# Patient Record
Sex: Female | Born: 1990
Health system: Southern US, Community
[De-identification: ages and names within clinical notes are randomized; demographics above are authoritative.]

## PROBLEM LIST (undated history)

## (undated) DIAGNOSIS — F32A Depression, unspecified: Secondary | ICD-10-CM

## (undated) DIAGNOSIS — R519 Headache, unspecified: Secondary | ICD-10-CM

## (undated) DIAGNOSIS — T7840XA Allergy, unspecified, initial encounter: Secondary | ICD-10-CM

## (undated) DIAGNOSIS — F329 Major depressive disorder, single episode, unspecified: Secondary | ICD-10-CM

## (undated) HISTORY — DX: Allergy, unspecified, initial encounter: T78.40XA

## (undated) HISTORY — DX: Depression, unspecified: F32.A

## (undated) HISTORY — DX: Major depressive disorder, single episode, unspecified: F32.9

---

## 2011-10-20 ENCOUNTER — Encounter (HOSPITAL_COMMUNITY): Payer: Self-pay | Admitting: Emergency Medicine

## 2011-10-20 ENCOUNTER — Emergency Department (HOSPITAL_COMMUNITY)
Admission: EM | Admit: 2011-10-20 | Discharge: 2011-10-21 | Disposition: A | Discharge status: Home / Self Care | Payer: PRIVATE HEALTH INSURANCE | Attending: Emergency Medicine | Admitting: Emergency Medicine

## 2011-10-20 DIAGNOSIS — L0501 Pilonidal cyst with abscess: Secondary | ICD-10-CM | POA: Insufficient documentation

## 2011-10-20 NOTE — ED Notes (Signed)
PT. REPORTS ABSCESS AT UPPER BUTTOCKS WITH DRAINAGE ONSET YESTERDAY.

## 2011-10-20 NOTE — ED Notes (Signed)
Pt reports having an abscess on the top of her butt crack.  Pt is noted to have a bloody napkin with a draining abscess.  No distress noted.  Resting. Family at bedside.

## 2011-10-21 MED ORDER — OXYCODONE-ACETAMINOPHEN 5-325 MG PO TABS: 1.0000 | ORAL_TABLET | Freq: Four times a day (QID) | ORAL | Status: AC | PRN

## 2011-10-21 MED ORDER — OXYCODONE-ACETAMINOPHEN 5-325 MG PO TABS
1.0000 | ORAL_TABLET | Freq: Once | ORAL | Status: AC
  Administered 2011-10-21: 1 via ORAL
  Filled 2011-10-21: qty 1

## 2011-10-21 MED ORDER — DOXYCYCLINE HYCLATE 100 MG PO TABS
100.0000 mg | ORAL_TABLET | Freq: Once | ORAL | Status: AC
  Administered 2011-10-21: 100 mg via ORAL
  Filled 2011-10-21: qty 1

## 2011-10-21 MED ORDER — DOXYCYCLINE HYCLATE 100 MG PO CAPS: 100.0000 mg | ORAL_CAPSULE | Freq: Two times a day (BID) | ORAL | Status: AC

## 2011-10-21 MED ORDER — CEPHALEXIN 250 MG PO CAPS
500.0000 mg | ORAL_CAPSULE | Freq: Once | ORAL | Status: AC
  Administered 2011-10-21: 500 mg via ORAL
  Filled 2011-10-21: qty 2

## 2011-10-21 NOTE — ED Provider Notes (Addendum)
History     CSN: [id number]  Arrival date & time 10/20/11  2153   First MD Initiated Contact with Patient 10/20/11 2341      Chief Complaint  Patient presents with  . Abscess    (Consider location/radiation/quality/duration/timing/severity/associated sxs/prior treatment) Patient is a 21 y.o. female presenting with abscess. The history is provided by the patient and a friend. No language interpreter was used.  Abscess  This is a new problem. The current episode started yesterday. The onset was gradual. The problem occurs continuously. Affected Location: gluteal cleft. The problem is severe. The abscess is characterized by painfulness and draining. Associated with: nothing. The abscess first occurred at home. Pertinent negatives include not drinking less and no fever. Her past medical history does not include atopy in family. There were no sick contacts. She has received no recent medical care.    History reviewed. No pertinent past medical history.  History reviewed. No pertinent past surgical history.  No family history on file.  History  Substance Use Topics  . Smoking status: Never Smoker   . Smokeless tobacco: Not on file  . Alcohol Use: No    OB History    Grav Para Term Preterm Abortions TAB SAB Ect Mult Living                  Review of Systems  Constitutional: Negative for fever and chills.  HENT: Negative.   Eyes: Negative.   Respiratory: Negative.   Gastrointestinal: Negative.   Genitourinary: Negative.   Musculoskeletal: Negative.   Skin: Positive for wound.  Neurological: Negative.   Hematological: Negative for adenopathy.  Psychiatric/Behavioral: Negative.     Allergies  Review of patient's allergies indicates no known allergies.  Home Medications  No current outpatient prescriptions on file.  BP 125/82  Pulse 112  Temp(Src) 99.3 F (37.4 C) (Oral)  Resp 20  SpO2 99%  LMP 10/05/2011  Physical Exam  Constitutional: She is oriented to  person, place, and time. She appears well-developed and well-nourished. No distress.  HENT:  Head: Normocephalic and atraumatic.  Eyes: Conjunctivae are normal. Pupils are equal, round, and reactive to light.  Neck: Normal range of motion. Neck supple.  Cardiovascular: Normal rate and regular rhythm.   Pulmonary/Chest: Effort normal and breath sounds normal. She has no wheezes.  Abdominal: Soft. Bowel sounds are normal. There is no tenderness. There is no rebound and no guarding.  Genitourinary:       At top of gluteal cleft 2 large openings drainage copious purulent drainage with scant blood, large purulent material on tissue brought by patient  Musculoskeletal: Normal range of motion.  Neurological: She is alert and oriented to person, place, and time.  Skin: Skin is dry. She is not diaphoretic.  Psychiatric: Thought content normal.    ED Course  Procedures (including critical care time)  Labs Reviewed - No data to display No results found.   No diagnosis found.    MDM  Tetanus UTD. No indication to open lesion further has copious amount of purulent drainage present and lesion is completely decompressed, flat and soft.  Follow up with general surgery for marsupialization.  Sitz baths take all medication return immediately for uncontrolled bleeding, fevers, chills, vomiting or diarrhea or swelling or any concerns.  Return for wound check in 2 days.  Patient verbaliZes understanding and agrees to follow up        Makari Portman K Aashika Carta-Rasch, MD 10/21/11 8469  Jasmine Awe, MD 10/21/11 [phone]

## 2012-10-18 ENCOUNTER — Emergency Department (HOSPITAL_COMMUNITY): Payer: PRIVATE HEALTH INSURANCE

## 2012-10-18 ENCOUNTER — Emergency Department (HOSPITAL_COMMUNITY)
Admission: EM | Admit: 2012-10-18 | Discharge: 2012-10-18 | Disposition: A | Discharge status: Home / Self Care | Payer: PRIVATE HEALTH INSURANCE | Attending: Emergency Medicine | Admitting: Emergency Medicine

## 2012-10-18 DIAGNOSIS — R209 Unspecified disturbances of skin sensation: Secondary | ICD-10-CM | POA: Insufficient documentation

## 2012-10-18 DIAGNOSIS — L272 Dermatitis due to ingested food: Secondary | ICD-10-CM | POA: Insufficient documentation

## 2012-10-18 DIAGNOSIS — R070 Pain in throat: Secondary | ICD-10-CM | POA: Insufficient documentation

## 2012-10-18 DIAGNOSIS — T7840XA Allergy, unspecified, initial encounter: Secondary | ICD-10-CM

## 2012-10-18 DIAGNOSIS — R1310 Dysphagia, unspecified: Secondary | ICD-10-CM | POA: Insufficient documentation

## 2012-10-18 DIAGNOSIS — R0602 Shortness of breath: Secondary | ICD-10-CM | POA: Insufficient documentation

## 2012-10-18 DIAGNOSIS — R0789 Other chest pain: Secondary | ICD-10-CM | POA: Insufficient documentation

## 2012-10-18 DIAGNOSIS — L299 Pruritus, unspecified: Secondary | ICD-10-CM | POA: Insufficient documentation

## 2012-10-18 MED ORDER — PREDNISONE 20 MG PO TABS
60.0000 mg | ORAL_TABLET | Freq: Once | ORAL | Status: AC
  Administered 2012-10-18: 60 mg via ORAL
  Filled 2012-10-18: qty 3

## 2012-10-18 MED ORDER — EPINEPHRINE 0.3 MG/0.3ML IJ DEVI: 0.3000 mg | INTRAMUSCULAR | Status: DC | PRN

## 2012-10-18 MED ORDER — DIPHENHYDRAMINE HCL 25 MG PO TABS: 25.0000 mg | ORAL_TABLET | Freq: Four times a day (QID) | ORAL | Status: DC

## 2012-10-18 MED ORDER — PREDNISONE 50 MG PO TABS: ORAL_TABLET | ORAL | Status: DC

## 2012-10-18 MED ORDER — FAMOTIDINE 20 MG PO TABS
20.0000 mg | ORAL_TABLET | Freq: Once | ORAL | Status: AC
  Administered 2012-10-18: 20 mg via ORAL
  Filled 2012-10-18: qty 1

## 2012-10-18 MED ORDER — FAMOTIDINE 20 MG PO TABS: 20.0000 mg | ORAL_TABLET | Freq: Two times a day (BID) | ORAL | Status: DC

## 2012-10-18 NOTE — ED Provider Notes (Signed)
History  This chart was scribed for Glynn Octave, MD by Shari Heritage, ED Scribe. The patient was seen in room A12C/A12C. Patient's care was started at 1457.  CSN: [id number]  Arrival date & time 10/18/12  1434   First MD Initiated Contact with Patient 10/18/12 1457      Chief Complaint  Patient presents with  . Allergic Reaction    The history is provided by the patient. No language interpreter was used.    HPI Comments: Holly Johns is a 22 y.o. female who presents to the Emergency Department complaining of moderate, constant, non-radiating chest tightness and throat discomfort onset 2 hours ago resulting from a possible allergic reaction. Patient reports associated difficulty swallowing, right arm itching, tingling in lips, and mild shortness of breath. Patient denies nausea, vomiting, abdominal pain or rash. Patient has a known allergy to peanuts and think she may have unwittingly eaten a cookie containing peanuts. Patient states that at the time of the incident she could not find her epi pen, so she called EMS. Patient was given Benadryl PO 50 mg by EMS prior to arrival. Patient reports no other significant past medical history. Patient denies possible pregnancy.   No past medical history on file.  No past surgical history on file.  No family history on file.  History  Substance Use Topics  . Smoking status: Never Smoker   . Smokeless tobacco: Not on file  . Alcohol Use: No    OB History    Grav Para Term Preterm Abortions TAB SAB Ect Mult Living                  Review of Systems A complete 10 system review of systems was obtained and all systems are negative except as noted in the HPI and PMH.   Allergies  Peanuts  Home Medications   Current Outpatient Rx  Name  Route  Sig  Dispense  Refill  . DIPHENHYDRAMINE HCL 25 MG PO TABS   Oral   Take 1 tablet (25 mg total) by mouth every 6 (six) hours.   20 tablet   0   . EPINEPHRINE 0.3 MG/0.3ML IJ DEVI  Intramuscular   Inject 0.3 mLs (0.3 mg total) into the muscle as needed.   1 Device   0   . FAMOTIDINE 20 MG PO TABS   Oral   Take 1 tablet (20 mg total) by mouth 2 (two) times daily.   30 tablet   0   . PREDNISONE 50 MG PO TABS      1 tablet PO daily   5 tablet   0     Triage Vitals: BP 129/78  Pulse 78  Temp 98.2 F (36.8 C) (Oral)  Resp 16  Ht 5\' 4"  (1.626 m)  Wt 140 lb (63.504 kg)  BMI 24.03 kg/m2  SpO2 98%  LMP 09/24/2012  Physical Exam  Constitutional: She is oriented to person, place, and time. She appears well-developed and well-nourished. No distress.  HENT:  Head: Normocephalic and atraumatic.  Mouth/Throat: Oropharynx is clear and moist.       Speaking in full sentences. No tongue or lip swelling. No uvular edema.  Eyes: Conjunctivae normal and EOM are normal.  Neck: Neck supple.  Cardiovascular: Normal rate and regular rhythm.   No murmur heard. Pulmonary/Chest: Effort normal and breath sounds normal. No respiratory distress. She has no wheezes. She has no rales. She exhibits tenderness.       L sided chest wall  tenderness  Abdominal: Soft. Bowel sounds are normal.  Musculoskeletal: She exhibits no edema and no tenderness.  Neurological: She is alert and oriented to person, place, and time.  Skin: Skin is warm. No rash noted.  Psychiatric: She has a normal mood and affect. Her behavior is normal.    ED Course  Procedures (including critical care time) DIAGNOSTIC STUDIES: Oxygen Saturation is 98% on room air, normal by my interpretation.    COORDINATION OF CARE: 3:08 PM- Patient informed of current plan for treatment and evaluation and agrees with plan at this time.      Labs Reviewed - No data to display Dg Chest 2 View  10/18/2012  *RADIOLOGY REPORT*  Clinical Data: Allergic reaction.  Shortness of breath.  CHEST - 2 VIEW  Comparison: None  Findings: The cardiac silhouette, mediastinal and hilar contours are normal.  The lungs are clear.  No  pleural effusion.  The bony thorax is intact.  IMPRESSION: Normal chest x-ray.   Original Report Authenticated By: Rudie Meyer, M.D.      1. Allergic reaction       MDM  Chest tightness, shortness of breath, throat irritation after eating a cookie that may contain peanuts. Symptoms have resolved after Benadryl. Speaking in full sentences, no respiratory distress, reproducible chest pain. She could not find her epipen so she called EMS.  Received only benadryl PO.  Vitals stable, no distress.  No tongue, lip, or throat swelling.  Observed in ED for one hour without deterioration. Discharged with steroids, antihistamines, refill EpiPen.   Date: 10/18/2012  Rate: 73  Rhythm: normal sinus rhythm  QRS Axis: normal  Intervals: normal  ST/T Wave abnormalities: normal  Conduction Disutrbances:none  Narrative Interpretation:   Old EKG Reviewed: none available      I personally performed the services described in this documentation, which was scribed in my presence. The recorded information has been reviewed and is accurate.    Glynn Octave, MD 10/18/12 1556

## 2012-10-18 NOTE — ED Notes (Signed)
Patient transported to X-ray 

## 2012-10-18 NOTE — ED Notes (Signed)
Per report from [facility] pt was at her home eating a cookie when she thinks that she may have ingested a peanut or some other type of nut.  She contacted EMS for SOB, R arm itching, and felt like her throat was "closing up".  Pt states that she could not find her epi-pen.  On EMS arrival pt reportedly had no outward signs of a allergic reaction (no hives or swelling).  She was administered 50mg  Benadryl PO by EMS.  Pt is able to talk in complete sentences.  No distress noted.

## 2014-05-01 ENCOUNTER — Emergency Department (HOSPITAL_COMMUNITY)
Admission: EM | Admit: 2014-05-01 | Discharge: 2014-05-01 | Disposition: A | Discharge status: Home / Self Care | Payer: Commercial Indemnity | Attending: Emergency Medicine | Admitting: Emergency Medicine

## 2014-05-01 ENCOUNTER — Encounter (HOSPITAL_COMMUNITY): Payer: Self-pay | Admitting: Emergency Medicine

## 2014-05-01 ENCOUNTER — Emergency Department (HOSPITAL_COMMUNITY): Payer: Commercial Indemnity

## 2014-05-01 DIAGNOSIS — R42 Dizziness and giddiness: Secondary | ICD-10-CM | POA: Insufficient documentation

## 2014-05-01 DIAGNOSIS — R110 Nausea: Secondary | ICD-10-CM | POA: Diagnosis not present

## 2014-05-01 DIAGNOSIS — Z3202 Encounter for pregnancy test, result negative: Secondary | ICD-10-CM | POA: Insufficient documentation

## 2014-05-01 DIAGNOSIS — D649 Anemia, unspecified: Secondary | ICD-10-CM

## 2014-05-01 DIAGNOSIS — R319 Hematuria, unspecified: Secondary | ICD-10-CM | POA: Insufficient documentation

## 2014-05-01 DIAGNOSIS — R1013 Epigastric pain: Secondary | ICD-10-CM | POA: Insufficient documentation

## 2014-05-01 DIAGNOSIS — R109 Unspecified abdominal pain: Secondary | ICD-10-CM | POA: Diagnosis present

## 2014-05-01 DIAGNOSIS — R52 Pain, unspecified: Secondary | ICD-10-CM | POA: Insufficient documentation

## 2014-05-01 LAB — CBC WITH DIFFERENTIAL/PLATELET
Basophils Absolute: 0 10*3/uL (ref 0.0–0.1)
Basophils Relative: 0 % (ref 0–1)
EOS PCT: 0 % (ref 0–5)
Eosinophils Absolute: 0 10*3/uL (ref 0.0–0.7)
HCT: 28.9 % — ABNORMAL LOW (ref 36.0–46.0)
Hemoglobin: 9.1 g/dL — ABNORMAL LOW (ref 12.0–15.0)
LYMPHS ABS: 1.2 10*3/uL (ref 0.7–4.0)
LYMPHS PCT: 17 % (ref 12–46)
MCH: 21.5 pg — ABNORMAL LOW (ref 26.0–34.0)
MCHC: 31.5 g/dL (ref 30.0–36.0)
MCV: 68.3 fL — AB (ref 78.0–100.0)
MONOS PCT: 9 % (ref 3–12)
Monocytes Absolute: 0.6 10*3/uL (ref 0.1–1.0)
Neutro Abs: 5.1 10*3/uL (ref 1.7–7.7)
Neutrophils Relative %: 74 % (ref 43–77)
PLATELETS: 346 10*3/uL (ref 150–400)
RBC: 4.23 MIL/uL (ref 3.87–5.11)
RDW: 16.6 % — ABNORMAL HIGH (ref 11.5–15.5)
WBC: 6.9 10*3/uL (ref 4.0–10.5)

## 2014-05-01 LAB — LIPASE, BLOOD: Lipase: 25 U/L (ref 11–59)

## 2014-05-01 LAB — URINE MICROSCOPIC-ADD ON

## 2014-05-01 LAB — COMPREHENSIVE METABOLIC PANEL
ALBUMIN: 3.4 g/dL — AB (ref 3.5–5.2)
ALK PHOS: 81 U/L (ref 39–117)
ALT: 17 U/L (ref 0–35)
AST: 23 U/L (ref 0–37)
Anion gap: 14 (ref 5–15)
BILIRUBIN TOTAL: 0.4 mg/dL (ref 0.3–1.2)
BUN: 6 mg/dL (ref 6–23)
CHLORIDE: 103 meq/L (ref 96–112)
CO2: 22 meq/L (ref 19–32)
CREATININE: 0.64 mg/dL (ref 0.50–1.10)
Calcium: 9 mg/dL (ref 8.4–10.5)
GFR calc Af Amer: >90 mL/min (ref >90)
Glucose, Bld: 103 mg/dL — ABNORMAL HIGH (ref 70–99)
POTASSIUM: 4.1 meq/L (ref 3.7–5.3)
SODIUM: 139 meq/L (ref 137–147)
Total Protein: 8.6 g/dL — ABNORMAL HIGH (ref 6.0–8.3)

## 2014-05-01 LAB — URINALYSIS, ROUTINE W REFLEX MICROSCOPIC
BILIRUBIN URINE: NEGATIVE
GLUCOSE, UA: NEGATIVE mg/dL
KETONES UR: NEGATIVE mg/dL
Nitrite: NEGATIVE
PH: 6.5 (ref 5.0–8.0)
Protein, ur: NEGATIVE mg/dL
SPECIFIC GRAVITY, URINE: 1.007 (ref 1.005–1.030)
Urobilinogen, UA: 0.2 mg/dL (ref 0.0–1.0)

## 2014-05-01 LAB — POC URINE PREG, ED: Preg Test, Ur: NEGATIVE

## 2014-05-01 MED ORDER — OMEPRAZOLE 20 MG PO CPDR: 20.0000 mg | DELAYED_RELEASE_CAPSULE | Freq: Every day | ORAL | Status: DC

## 2014-05-01 MED ORDER — FENTANYL CITRATE 0.05 MG/ML IJ SOLN
50.0000 ug | INTRAMUSCULAR | Status: DC | PRN
  Administered 2014-05-01: 50 ug via INTRAVENOUS
  Filled 2014-05-01: qty 2

## 2014-05-01 MED ORDER — FAMOTIDINE 20 MG PO TABS
20.0000 mg | ORAL_TABLET | Freq: Once | ORAL | Status: AC
  Administered 2014-05-01: 20 mg via ORAL
  Filled 2014-05-01: qty 1

## 2014-05-01 MED ORDER — GI COCKTAIL ~~LOC~~
30.0000 mL | Freq: Once | ORAL | Status: AC
  Administered 2014-05-01: 30 mL via ORAL
  Filled 2014-05-01: qty 30

## 2014-05-01 MED ORDER — SODIUM CHLORIDE 0.9 % IV BOLUS (SEPSIS)
500.0000 mL | Freq: Once | INTRAVENOUS | Status: AC
  Administered 2014-05-01: 500 mL via INTRAVENOUS

## 2014-05-01 MED ORDER — IOHEXOL 300 MG/ML  SOLN
80.0000 mL | Freq: Once | INTRAMUSCULAR | Status: AC | PRN
  Administered 2014-05-01: 80 mL via INTRAVENOUS

## 2014-05-01 NOTE — ED Notes (Signed)
Pt presents to department for evaluation of abdominal pain. Ongoing x1 week. Denies urinary symptoms. Denies vaginal bleeding/discharge. 6/10 pain upon arrival, becomes worse with movement. Pt is alert and oriented x4.

## 2014-05-01 NOTE — Discharge Instructions (Signed)
If you were given medicines take as directed.  If you are on coumadin or contraceptives realize their levels and effectiveness is altered by many different medicines.  If you have any reaction (rash, tongues swelling, other) to the medicines stop taking and see a physician.   Please follow up as directed and return to the ER or see a physician for new or worsening symptoms.  Thank you. Filed Vitals:   05/01/14 1930 05/01/14 2018 05/01/14 2020 05/01/14 2022  BP: 110/77 105/65  105/65  Pulse: 88  90 80  Temp:      TempSrc:      Resp: 24   21  SpO2: 100%  100% 100%    Abdominal Pain, Women Abdominal (stomach, pelvic, or belly) pain can be caused by many things. It is important to tell your doctor:  The location of the pain.  Does it come and go or is it present all the time?  Are there things that start the pain (eating certain foods, exercise)?  Are there other symptoms associated with the pain (fever, nausea, vomiting, diarrhea)? All of this is helpful to know when trying to find the cause of the pain. CAUSES   Stomach: virus or bacteria infection, or ulcer.  Intestine: appendicitis (inflamed appendix), regional ileitis (Crohn's disease), ulcerative colitis (inflamed colon), irritable bowel syndrome, diverticulitis (inflamed diverticulum of the colon), or cancer of the stomach or intestine.  Gallbladder disease or stones in the gallbladder.  Kidney disease, kidney stones, or infection.  Pancreas infection or cancer.  Fibromyalgia (pain disorder).  Diseases of the female organs:  Uterus: fibroid (non-cancerous) tumors or infection.  Fallopian tubes: infection or tubal pregnancy.  Ovary: cysts or tumors.  Pelvic adhesions (scar tissue).  Endometriosis (uterus lining tissue growing in the pelvis and on the pelvic organs).  Pelvic congestion syndrome (female organs filling up with blood just before the menstrual period).  Pain with the menstrual period.  Pain with  ovulation (producing an egg).  Pain with an IUD (intrauterine device, birth control) in the uterus.  Cancer of the female organs.  Functional pain (pain not caused by a disease, may improve without treatment).  Psychological pain.  Depression. DIAGNOSIS  Your doctor will decide the seriousness of your pain by doing an examination.  Blood tests.  X-rays.  Ultrasound.  CT scan (computed tomography, special type of X-ray).  MRI (magnetic resonance imaging).  Cultures, for infection.  Barium enema (dye inserted in the large intestine, to better view it with X-rays).  Colonoscopy (looking in intestine with a lighted tube).  Laparoscopy (minor surgery, looking in abdomen with a lighted tube).  Major abdominal exploratory surgery (looking in abdomen with a large incision). TREATMENT  The treatment will depend on the cause of the pain.   Many cases can be observed and treated at home.  Over-the-counter medicines recommended by your caregiver.  Prescription medicine.  Antibiotics, for infection.  Birth control pills, for painful periods or for ovulation pain.  Hormone treatment, for endometriosis.  Nerve blocking injections.  Physical therapy.  Antidepressants.  Counseling with a psychologist or psychiatrist.  Minor or major surgery. HOME CARE INSTRUCTIONS   Do not take laxatives, unless directed by your caregiver.  Take over-the-counter pain medicine only if ordered by your caregiver. Do not take aspirin because it can cause an upset stomach or bleeding.  Try a clear liquid diet (broth or water) as ordered by your caregiver. Slowly move to a bland diet, as tolerated, if the pain is  related to the stomach or intestine.  Have a thermometer and take your temperature several times a day, and record it.  Bed rest and sleep, if it helps the pain.  Avoid sexual intercourse, if it causes pain.  Avoid stressful situations.  Keep your follow-up appointments and  tests, as your caregiver orders.  If the pain does not go away with medicine or surgery, you may try:  Acupuncture.  Relaxation exercises (yoga, meditation).  Group therapy.  Counseling. SEEK MEDICAL CARE IF:   You notice certain foods cause stomach pain.  Your home care treatment is not helping your pain.  You need stronger pain medicine.  You want your IUD removed.  You feel faint or lightheaded.  You develop nausea and vomiting.  You develop a rash.  You are having side effects or an allergy to your medicine. SEEK IMMEDIATE MEDICAL CARE IF:   Your pain does not go away or gets worse.  You have a fever.  Your pain is felt only in portions of the abdomen. The right side could possibly be appendicitis. The left lower portion of the abdomen could be colitis or diverticulitis.  You are passing blood in your stools (bright red or black tarry stools, with or without vomiting).  You have blood in your urine.  You develop chills, with or without a fever.  You pass out. MAKE SURE YOU:   Understand these instructions.  Will watch your condition.  Will get help right away if you are not doing well or get worse. Document Released: 07/04/2007 Document Revised: 01/21/2014 Document Reviewed: 07/24/2009 [facility] Patient Information 2015 [place], [place]. This information is not intended to replace advice given to you by your health care provider. Make sure you discuss any questions you have with your health care provider.

## 2014-05-01 NOTE — ED Provider Notes (Signed)
CSN: [id number]     Arrival date & time 05/01/14  1338 History   First MD Initiated Contact with Patient 05/01/14 1617     Chief Complaint  Patient presents with  . Abdominal Pain     (Consider location/radiation/quality/duration/timing/severity/associated sxs/prior Treatment) HPI Comments: 23 year old female with anemia history presents with worsening upper abdominal pain for the past week. No history of similar. No vaginal symptoms her current vaginal bleeding. Patient denies pelvic infectious symptoms. Patient denies reflux oral surgery history, no alcohol or smoking history. Patient held the otherwise. Severe ache central and epigastric region. No known gallbladder problems, no worsening with eating.  Patient is a 23 y.o. female presenting with abdominal pain. The history is provided by the patient.  Abdominal Pain Associated symptoms: nausea   Associated symptoms: no chest pain, no chills, no dysuria, no fever, no shortness of breath and no vomiting     History reviewed. No pertinent past medical history. History reviewed. No pertinent past surgical history. No family history on file. History  Substance Use Topics  . Smoking status: Never Smoker   . Smokeless tobacco: Not on file  . Alcohol Use: No   OB History   Grav Para Term Preterm Abortions TAB SAB Ect Mult Living                 Review of Systems  Constitutional: Negative for fever and chills.  HENT: Negative for congestion.   Eyes: Negative for visual disturbance.  Respiratory: Negative for shortness of breath.   Cardiovascular: Negative for chest pain.  Gastrointestinal: Positive for nausea and abdominal pain. Negative for vomiting.  Genitourinary: Negative for dysuria and flank pain.  Musculoskeletal: Negative for back pain, neck pain and neck stiffness.  Skin: Negative for rash.  Neurological: Positive for light-headedness. Negative for headaches.      Allergies  Peanuts  Home Medications   Prior to  Admission medications   Medication Sig Start Date End Date Taking? Authorizing Provider  EPINEPHrine 0.3 mg/0.3 mL IJ SOAJ injection Inject 0.3 mg into the muscle daily as needed (allergic reaction).   Yes Historical Provider, MD  medroxyPROGESTERone (DEPO-PROVERA) 150 MG/ML injection Inject 150 mg into the muscle every 3 (three) months.   Yes Historical Provider, MD  naproxen sodium (ANAPROX) 220 MG tablet Take 660 mg by mouth daily as needed (pain).   Yes Historical Provider, MD   BP 123/76  Pulse 116  Temp(Src) 98.5 F (36.9 C) (Oral)  Resp 16  SpO2 99% Physical Exam  Nursing note and vitals reviewed. Constitutional: She is oriented to person, place, and time. She appears well-developed and well-nourished.  HENT:  Head: Normocephalic and atraumatic.  Mild dry mucous membranes  Eyes: Conjunctivae are normal. Right eye exhibits no discharge. Left eye exhibits no discharge.  Neck: Normal range of motion. Neck supple. No tracheal deviation present.  Cardiovascular: Normal rate and regular rhythm.   Pulmonary/Chest: Effort normal and breath sounds normal.  Abdominal: Soft. She exhibits no distension. There is tenderness (epigastric, central). There is no guarding.  Musculoskeletal: She exhibits no edema.  Neurological: She is alert and oriented to person, place, and time.  Skin: Skin is warm. No rash noted.  Psychiatric: She has a normal mood and affect.    ED Course  Procedures (including critical care time) Labs Review Labs Reviewed  CBC WITH DIFFERENTIAL - Abnormal; Notable for the following:    Hemoglobin 9.1 (*)    HCT 28.9 (*)    MCV 68.3 (*)  MCH 21.5 (*)    RDW 16.6 (*)    All other components within normal limits  COMPREHENSIVE METABOLIC PANEL - Abnormal; Notable for the following:    Glucose, Bld 103 (*)    Total Protein 8.6 (*)    Albumin 3.4 (*)    All other components within normal limits  URINALYSIS, ROUTINE W REFLEX MICROSCOPIC - Abnormal; Notable for the  following:    Hgb urine dipstick LARGE (*)    Leukocytes, UA TRACE (*)    All other components within normal limits  LIPASE, BLOOD  URINE MICROSCOPIC-ADD ON  POC URINE PREG, ED    Imaging Review Ct Abdomen Pelvis W Contrast  05/01/2014   CLINICAL DATA:  Abdominal pain for 1 week.  EXAM: CT ABDOMEN AND PELVIS WITH CONTRAST  TECHNIQUE: Multidetector CT imaging of the abdomen and pelvis was performed using the standard protocol following bolus administration of intravenous contrast.  CONTRAST:  80mL OMNIPAQUE IOHEXOL 300 MG/ML  SOLN  COMPARISON:  None.  FINDINGS: Clear lung bases.  Heart is normal in size.  Liver, spleen, gallbladder and pancreas are unremarkable. There is a trace amount of fluid attenuation between the gallbladder and liver which is nonspecific. No bile duct dilation.  No adrenal masses. Normal kidneys, ureters and bladder. Uterus and adnexa are unremarkable.  No adenopathy  Retrocecal appendix measures mildly dilated at 7.2 mm, but the wall is thin and the appendix contains significant air. This is likely a normal variant in this patient. There is some subtle haziness adjacent to the ascending colon and hepatic flexure, but no bowel wall thickening is seen. Mesentery otherwise unremarkable. Colon small bowel are unremarkable.  No bony abnormality.  IMPRESSION: 1. There is subtle haziness in the fat along the inferior margin of the liver adjacent to the hepatic flexure of the colon and upper ascending colon. There is also a small amount of fluid attenuation material between the gallbladder and liver. Consider liver inflammation in the proper clinical setting. No convincing colitis. No gallbladder wall thickening to suggest acute cholecystitis. Appendix appears mildly distended, but has a thin wall and is mostly filled with air. This is felt to be a normal variant for this patient. 2. No other evidence of an acute abnormality. Exam otherwise unremarkable.   Electronically Signed   By: Amie Portland M.D.   On: 05/01/2014 20:17     EKG Interpretation None      MDM   Final diagnoses:  Abdominal pain, acute  Hematuria  Anemia, unspecified   Healthy patient with worsening central and epigastric nominal pain. Discussed differential diagnosis including reflux, ulcer, atypical appendectomy. Patient has possible history of clotting/bleeding disorder patient unsure. Plan for CT abdomen pelvis for further details of pain and discussed outpatient followup if unremarkable. Patient has anemia history, he will and 9 without active bleeding in the ER, discussed outpatient followup for this as well.  Pt improved on recheck, CT results unremarkable.  Results and differential diagnosis were discussed with the patient/parent/guardian. Close follow up outpatient was discussed, comfortable with the plan.   Medications  gi cocktail (Maalox,Lidocaine,Donnatal) (not administered)  famotidine (PEPCID) tablet 20 mg (not administered)  fentaNYL (SUBLIMAZE) injection 50 mcg (not administered)  sodium chloride 0.9 % bolus 500 mL (not administered)    Filed Vitals:   05/01/14 1645 05/01/14 1700 05/01/14 1715 05/01/14 1730  BP: 105/63 102/64 104/64 103/80  Pulse: 99 101 98 101  Temp:      TempSrc:  Resp:      SpO2: 98% 100% 96% 100%        Enid SkeensJoshua M Ruthy Forry, MD 05/02/14 0120

## 2014-07-25 ENCOUNTER — Encounter (HOSPITAL_COMMUNITY): Payer: Self-pay

## 2014-07-25 ENCOUNTER — Emergency Department (HOSPITAL_COMMUNITY): Payer: Commercial Indemnity

## 2014-07-25 ENCOUNTER — Emergency Department (HOSPITAL_COMMUNITY)
Admission: EM | Admit: 2014-07-25 | Discharge: 2014-07-25 | Disposition: A | Discharge status: Home / Self Care | Payer: Commercial Indemnity | Attending: Emergency Medicine | Admitting: Emergency Medicine

## 2014-07-25 DIAGNOSIS — S79912A Unspecified injury of left hip, initial encounter: Secondary | ICD-10-CM | POA: Insufficient documentation

## 2014-07-25 DIAGNOSIS — S299XXA Unspecified injury of thorax, initial encounter: Secondary | ICD-10-CM | POA: Insufficient documentation

## 2014-07-25 DIAGNOSIS — Y9389 Activity, other specified: Secondary | ICD-10-CM | POA: Insufficient documentation

## 2014-07-25 DIAGNOSIS — Z79899 Other long term (current) drug therapy: Secondary | ICD-10-CM | POA: Insufficient documentation

## 2014-07-25 DIAGNOSIS — Y92410 Unspecified street and highway as the place of occurrence of the external cause: Secondary | ICD-10-CM | POA: Diagnosis not present

## 2014-07-25 DIAGNOSIS — S62502A Fracture of unspecified phalanx of left thumb, initial encounter for closed fracture: Secondary | ICD-10-CM

## 2014-07-25 DIAGNOSIS — S6992XA Unspecified injury of left wrist, hand and finger(s), initial encounter: Secondary | ICD-10-CM | POA: Diagnosis present

## 2014-07-25 DIAGNOSIS — S62522A Displaced fracture of distal phalanx of left thumb, initial encounter for closed fracture: Secondary | ICD-10-CM | POA: Insufficient documentation

## 2014-07-25 MED ORDER — IBUPROFEN 800 MG PO TABS
800.0000 mg | ORAL_TABLET | Freq: Once | ORAL | Status: AC
  Administered 2014-07-25: 800 mg via ORAL
  Filled 2014-07-25: qty 1

## 2014-07-25 MED ORDER — TRAMADOL HCL 50 MG PO TABS: 50.0000 mg | ORAL_TABLET | Freq: Four times a day (QID) | ORAL | Status: DC | PRN

## 2014-07-25 NOTE — Discharge Instructions (Signed)
Finger Fracture °A finger fracture is when one or more bones in the finger break.  °HOME CARE  °· Wear the splint, tape, or cast as long as told by your doctor. °· Keep your fingers in the position your doctor tell you to. °· Raise (elevate) the injured area above the level of the heart. °· Only take medicine as told by your doctor. °· Put ice on the injured area. °¨ Put ice in a plastic bag. °¨ Place a towel between the skin and the bag. °¨ Leave the ice on for 15-20 minutes, 03-04 times a day. °· Follow up with your doctor. °· Ask what exercises you can do when the splint comes off. °GET HELP RIGHT AWAY IF:  °· The fingernails are white or bluish. °· You have pain not helped by medicine. °· You cannot move your fingertips. °· You lose feeling (numbness) in the injured finger(s). °MAKE SURE YOU:  °· Understand these instructions. °· Will watch this condition. °· Will get help right away if you are not doing well or get worse. °Document Released: 02/23/2008 Document Revised: 11/29/2011 Document Reviewed: 02/23/2008 °[employer]® Patient Information ©2015 [employer]. This information is not intended to replace advice given to you by your health care provider. Make sure you discuss any questions you have with your health care provider. ° °
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
## 2017-03-07 ENCOUNTER — Ambulatory Visit (INDEPENDENT_AMBULATORY_CARE_PROVIDER_SITE_OTHER): Payer: Managed Care, Other (non HMO) | Admitting: Family Medicine

## 2017-03-07 VITALS — BP 108/74 | HR 72 | Temp 98.1°F | Ht 64.5 in | Wt 135.4 lb

## 2017-03-07 DIAGNOSIS — F339 Major depressive disorder, recurrent, unspecified: Secondary | ICD-10-CM

## 2017-03-07 DIAGNOSIS — Z111 Encounter for screening for respiratory tuberculosis: Secondary | ICD-10-CM | POA: Diagnosis not present

## 2017-03-07 LAB — CBC
HCT: 34.6 % — ABNORMAL LOW (ref 36.0–46.0)
Hemoglobin: 11.1 g/dL — ABNORMAL LOW (ref 12.0–15.0)
MCHC: 32 g/dL (ref 30.0–36.0)
MCV: 72.6 fl — ABNORMAL LOW (ref 78.0–100.0)
Platelets: 408 10*3/uL — ABNORMAL HIGH (ref 150.0–400.0)
RBC: 4.76 Mil/uL (ref 3.87–5.11)
RDW: 16.7 % — ABNORMAL HIGH (ref 11.5–15.5)
WBC: 8.1 10*3/uL (ref 4.0–10.5)

## 2017-03-07 LAB — COMPREHENSIVE METABOLIC PANEL
ALT: 10 U/L (ref 0–35)
AST: 19 U/L (ref 0–37)
Albumin: 4.4 g/dL (ref 3.5–5.2)
Alkaline Phosphatase: 78 U/L (ref 39–117)
BUN: 14 mg/dL (ref 6–23)
CO2: 28 mEq/L (ref 19–32)
Calcium: 9.9 mg/dL (ref 8.4–10.5)
Chloride: 101 mEq/L (ref 96–112)
Creatinine, Ser: 0.69 mg/dL (ref 0.40–1.20)
GFR: 133.71 mL/min (ref >60.00)
Glucose, Bld: 85 mg/dL (ref 70–99)
Potassium: 4 mEq/L (ref 3.5–5.1)
Sodium: 135 mEq/L (ref 135–145)
Total Bilirubin: 0.2 mg/dL (ref 0.2–1.2)
Total Protein: 8.5 g/dL — ABNORMAL HIGH (ref 6.0–8.3)

## 2017-03-07 LAB — TSH: TSH: 2.71 u[IU]/mL (ref 0.35–4.50)

## 2017-03-07 NOTE — Progress Notes (Signed)
Holly Johns is a 26 y.o. female [facility]   Patient Care Team: Helane RimaWallace, Steed Kanaan, DO as PCP - General (Family Medicine)   History of Present Illness:   Britt BottomJamie Wheeley CMA acting as scribe for Dr. Earlene PlaterWallace.  Depression         This is a new problem.  The current episode started more than 1 month ago.   The onset quality is gradual.   The problem occurs daily.  The problem has been waxing and waning since onset.  Associated symptoms include no headaches.     The symptoms are aggravated by social issues.  Past treatments include nothing.  Compliance with prior treatments: [facility] and is working on the wellness treatment instead of medication.   Improvement on treatment: She has only seen counsler once.   Risk factors include stress.   Health Maintenance Due  Topic Date Due  . HIV Screening  07/16/2007  . TETANUS/TDAP  07/16/2011  . PAP SMEAR  07/15/2013   PMHx, SurgHx, SocialHx, Medications, and Allergies were reviewed in the Visit Navigator and updated as appropriate.   Past Medical History:  Diagnosis Date  . Allergy   . Depression    History reviewed. No pertinent surgical history.  Family History  Problem Relation Age of Onset  . Diabetes Mother   . Stroke Mother   . Diabetes Father    Social History  Substance Use Topics  . Smoking status: Never Smoker  . Smokeless tobacco: Never Used  . Alcohol use No   Current Medications and Allergies:   Current Outpatient Prescriptions:  .  EPINEPHrine 0.3 mg/0.3 mL IJ SOAJ injection, Inject 0.3 mg into the muscle daily as needed (allergic reaction)., Disp: , Rfl:   Allergies  Allergen Reactions  . Peanuts [Peanut Oil] Hives and Itching   Review of Systems:   Review of Systems  Constitutional: Negative for chills, fever and malaise/fatigue.  HENT: Negative for ear pain, sinus pain and sore throat.   Eyes: Negative for blurred vision and double vision.  Respiratory: Negative for  cough, shortness of breath and wheezing.   Cardiovascular: Negative for chest pain, palpitations and leg swelling.  Gastrointestinal: Negative for abdominal pain, nausea and vomiting.  Musculoskeletal: Positive for back pain. Negative for joint pain and neck pain.       Chronic.   Neurological: Negative for dizziness and headaches.  Psychiatric/Behavioral: Negative for depression, hallucinations and memory loss.   Vitals:   Vitals:   03/07/17 1007  BP: 108/74  Pulse: 72  Temp: 98.1 F (36.7 C)  TempSrc: Oral  SpO2: 100%  Weight: 135 lb 6.4 oz (61.4 kg)  Height: 5' 4.5" (1.638 m)     Body mass index is 22.88 kg/m.  Physical Exam:   Physical Exam  Constitutional: She appears well-developed and well-nourished. No distress.  HENT:  Head: Normocephalic and atraumatic.  Eyes: EOM are normal. Pupils are equal, round, and reactive to light.  Neck: Normal range of motion. Neck supple. No thyromegaly present.  Cardiovascular: Normal rate, regular rhythm, normal heart sounds and intact distal pulses.   Pulmonary/Chest: Effort normal.  Abdominal: Soft.  Skin: Skin is warm.  Psychiatric: She has a normal mood and affect. Her behavior is normal.  Nursing note and vitals reviewed.  Results for orders placed or performed in visit on 03/07/17  CBC  Result Value Ref Range   WBC 8.1 4.0 - 10.5 K/uL   RBC 4.76 3.87 -  5.11 Mil/uL   Platelets 408.0 (H) 150.0 - 400.0 K/uL   Hemoglobin 11.1 (L) 12.0 - 15.0 g/dL   HCT 16.1 (L) 09.6 - 04.5 %   MCV 72.6 (L) 78.0 - 100.0 fl   MCHC 32.0 30.0 - 36.0 g/dL   RDW 40.9 (H) 81.1 - 91.4 %  Comprehensive metabolic panel  Result Value Ref Range   Sodium 135 135 - 145 mEq/L   Potassium 4.0 3.5 - 5.1 mEq/L   Chloride 101 96 - 112 mEq/L   CO2 28 19 - 32 mEq/L   Glucose, Bld 85 70 - 99 mg/dL   BUN 14 6 - 23 mg/dL   Creatinine, Ser 7.82 0.40 - 1.20 mg/dL   Total Bilirubin 0.2 0.2 - 1.2 mg/dL   Alkaline Phosphatase 78 39 - 117 U/L   AST 19 0 - 37  U/L   ALT 10 0 - 35 U/L   Total Protein 8.5 (H) 6.0 - 8.3 g/dL   Albumin 4.4 3.5 - 5.2 g/dL   Calcium 9.9 8.4 - 95.6 mg/dL   GFR 213.08 >65.78 mL/min  TSH  Result Value Ref Range   TSH 2.71 0.35 - 4.50 uIU/mL   Assessment and Plan:   Marshayla was seen today for establish care.  Diagnoses and all orders for this visit:  Depression, recurrent (HCC) Comments: With atypical features. No organic causes found on labwork. We discussed medication options. She is already in therapy. No red flags. Will offer support.  Orders: -     CBC -     Comprehensive metabolic panel -     TSH  Visit for TB skin test -     PPD   . Reviewed expectations re: course of current medical issues. . Discussed self-management of symptoms. . Outlined signs and symptoms indicating need for more acute intervention. . Patient verbalized understanding and all questions were answered. Marland Kitchen Health Maintenance issues including appropriate healthy diet, exercise, and smoking avoidance were discussed with patient. . See orders for this visit as documented in the electronic medical record. . Patient received an After Visit Summary.  CMA served as Neurosurgeon during this visit. History, Physical, and Plan performed by medical provider. The above documentation has been reviewed and is accurate and complete. Helane Rima, D.O.  Helane Rima, DO [place] 03/10/2017  Future Appointments Date Time Provider Department Center  06/07/2017 10:45 AM Helane Rima, DO [facility] None

## 2017-03-08 ENCOUNTER — Encounter: Payer: Self-pay | Admitting: Family Medicine

## 2017-03-09 ENCOUNTER — Ambulatory Visit: Payer: Managed Care, Other (non HMO) | Admitting: *Deleted

## 2017-03-09 LAB — TB SKIN TEST
Induration: 0 mm
TB Skin Test: NEGATIVE

## 2017-03-14 DIAGNOSIS — Z111 Encounter for screening for respiratory tuberculosis: Secondary | ICD-10-CM | POA: Diagnosis not present

## 2017-03-18 ENCOUNTER — Telehealth: Payer: Self-pay | Admitting: Family Medicine

## 2017-03-18 NOTE — Telephone Encounter (Signed)
Patient returning phone call to Autumn. Please call patient to advise about lab results.

## 2017-03-21 MED ORDER — SERTRALINE HCL 50 MG PO TABS: 50.0000 mg | ORAL_TABLET | Freq: Every day | ORAL | 5 refills | Status: DC

## 2017-03-21 NOTE — Telephone Encounter (Signed)
Spoke with patient. She is aware of her lab results, medication has been sent into the pharmacy. She was made aware to call if she has any questions.

## 2017-06-07 ENCOUNTER — Ambulatory Visit (INDEPENDENT_AMBULATORY_CARE_PROVIDER_SITE_OTHER): Payer: Managed Care, Other (non HMO) | Admitting: Family Medicine

## 2017-06-07 ENCOUNTER — Encounter: Payer: Self-pay | Admitting: Family Medicine

## 2017-06-07 ENCOUNTER — Telehealth: Payer: Self-pay | Admitting: Family Medicine

## 2017-06-07 VITALS — BP 108/64 | HR 88 | Temp 98.3°F | Ht 65.0 in | Wt 129.8 lb

## 2017-06-07 DIAGNOSIS — F341 Dysthymic disorder: Secondary | ICD-10-CM

## 2017-06-07 DIAGNOSIS — G4726 Circadian rhythm sleep disorder, shift work type: Secondary | ICD-10-CM

## 2017-06-07 NOTE — Telephone Encounter (Signed)
New Script for Provigil  [facility].  Ty,  -LL

## 2017-06-07 NOTE — Patient Instructions (Signed)
Check out PROVIGIL and MELATONIN (2-3 mg po q hs).

## 2017-06-07 NOTE — Progress Notes (Signed)
Holly Johns is a 26 y.o. female is here for follow up.  History of Present Illness:   Britt Bottom CMA acting as scribe for Dr. Earlene Plater.  HPI: Patient comes in today for follow up on her depression. She has been taking medication as prescribed. She could tell a difference at the beginning of taking medication, but now can't tell a difference.   Fatigue is her most concerning symptoms.  She is going to school, working. TH, F, Sat - night shift. Sleeping excessively the rest of the week. Considers herself a "night owl" now.   Health Maintenance Due  Topic Date Due  . HIV Screening  07/16/2007  . TETANUS/TDAP  07/16/2011  . PAP SMEAR  07/15/2013   Depression screen [facility] 2/9 06/07/2017 03/07/2017  Decreased Interest 2 2  Down, Depressed, Hopeless 3 2  PHQ - 2 Score 5 4  Altered sleeping 3 3  Tired, decreased energy 3 2  Change in appetite 3 3  Feeling bad or failure about yourself  1 0  Trouble concentrating 2 3  Moving slowly or fidgety/restless 2 2  Suicidal thoughts 0 0  PHQ-9 Score 19 17  Difficult doing work/chores Very difficult -   PMHx, SurgHx, SocialHx, FamHx, Medications, and Allergies were reviewed in the Visit Navigator and updated as appropriate.   Patient Active Problem List   Diagnosis Date Noted  . Dysthymia 06/12/2017  . Shift work sleep disorder 06/12/2017   Social History  Substance Use Topics  . Smoking status: Never Smoker  . Smokeless tobacco: Never Used  . Alcohol use No   Current Medications and Allergies:   Current Outpatient Prescriptions:  .  EPINEPHrine 0.3 mg/0.3 mL IJ SOAJ injection, Inject 0.3 mg into the muscle daily as needed (allergic reaction)., Disp: , Rfl:  .  sertraline (ZOLOFT) 50 MG tablet, Take 1 tablet (50 mg total) by mouth daily., Disp: 30 tablet, Rfl: 5   Allergies  Allergen Reactions  . Peanuts [Peanut Oil] Hives and Itching   Review of Systems   Pertinent items are noted in the HPI. Otherwise, ROS is  negative.  Vitals:   Vitals:   06/07/17 1100  BP: 108/64  Pulse: 88  Temp: 98.3 F (36.8 C)  TempSrc: Oral  SpO2: 98%  Weight: 129 lb 12.8 oz (58.9 kg)  Height:  (1.651 m)     Body mass index is 21.6 kg/m.   Physical Exam:   Physical Exam  Constitutional: She appears well-nourished.  HENT:  Head: Normocephalic and atraumatic.  Eyes: Pupils are equal, round, and reactive to light. EOM are normal.  Neck: Normal range of motion. Neck supple.  Cardiovascular: Normal rate, regular rhythm, normal heart sounds and intact distal pulses.   Pulmonary/Chest: Effort normal.  Abdominal: Soft.  Skin: Skin is warm.  Psychiatric: She has a normal mood and affect. Her behavior is normal.  Nursing note and vitals reviewed.   Results for orders placed or performed in visit on 03/07/17  CBC  Result Value Ref Range   WBC 8.1 4.0 - 10.5 K/uL   RBC 4.76 3.87 - 5.11 Mil/uL   Platelets 408.0 (H) 150.0 - 400.0 K/uL   Hemoglobin 11.1 (L) 12.0 - 15.0 g/dL   HCT 16.1 (L) 09.6 - 04.5 %   MCV 72.6 (L) 78.0 - 100.0 fl   MCHC 32.0 30.0 - 36.0 g/dL   RDW 40.9 (H) 81.1 - 91.4 %  Comprehensive metabolic panel  Result Value Ref Range   Sodium  135 135 - 145 mEq/L   Potassium 4.0 3.5 - 5.1 mEq/L   Chloride 101 96 - 112 mEq/L   CO2 28 19 - 32 mEq/L   Glucose, Bld 85 70 - 99 mg/dL   BUN 14 6 - 23 mg/dL   Creatinine, Ser 8.11 0.40 - 1.20 mg/dL   Total Bilirubin 0.2 0.2 - 1.2 mg/dL   Alkaline Phosphatase 78 39 - 117 U/L   AST 19 0 - 37 U/L   ALT 10 0 - 35 U/L   Total Protein 8.5 (H) 6.0 - 8.3 g/dL   Albumin 4.4 3.5 - 5.2 g/dL   Calcium 9.9 8.4 - 91.4 mg/dL   GFR 782.95 >62.13 mL/min  TSH  Result Value Ref Range   TSH 2.71 0.35 - 4.50 uIU/mL  PPD  Result Value Ref Range   TB Skin Test Negative    Induration 0.00 mm   Assessment and Plan:   [name] was seen today for depression.  Diagnoses and all orders for this visit:  Dysthymia Comments: Stable. Will continue current regimen  at this time - SSRI plus therapy. Discussed possiblity of Wellbutrin in the future.   Shift work sleep disorder Comments: Reviewed Provigil as an option. Excess fatigue seems to be one of her major "depression" symptoms, but her schedule is off. Will trial to see how much it helps.   . Reviewed expectations re: course of current medical issues. . Discussed self-management of symptoms. . Outlined signs and symptoms indicating need for more acute intervention. . Patient verbalized understanding and all questions were answered. Marland Kitchen Health Maintenance issues including appropriate healthy diet, exercise, and smoking avoidance were discussed with patient. . See orders for this visit as documented in the electronic medical record. . Patient received an After Visit Summary.  CMA served as Neurosurgeon during this visit. History, Physical, and Plan performed by medical provider. The above documentation has been reviewed and is accurate and complete. Helane Rima, D.O.  Helane Rima, DO [place] 06/12/2017  No future appointments.

## 2017-06-08 MED ORDER — MODAFINIL 200 MG PO TABS: ORAL_TABLET | ORAL | 0 refills | Status: DC

## 2017-06-08 NOTE — Telephone Encounter (Signed)
Completed.

## 2017-06-12 DIAGNOSIS — F341 Dysthymic disorder: Secondary | ICD-10-CM | POA: Insufficient documentation

## 2017-06-12 DIAGNOSIS — G4726 Circadian rhythm sleep disorder, shift work type: Secondary | ICD-10-CM | POA: Insufficient documentation

## 2017-06-17 ENCOUNTER — Telehealth: Payer: Self-pay

## 2017-06-17 NOTE — Telephone Encounter (Signed)
Modafinil PA initiated through Cover My Meds.  Waiting for insurance decision.

## 2017-06-21 NOTE — Telephone Encounter (Signed)
Modafinil approved through 06/20/2018.  Patient's pharmacy notified.

## 2017-06-27 ENCOUNTER — Telehealth: Payer: Self-pay | Admitting: Family Medicine

## 2017-06-27 ENCOUNTER — Other Ambulatory Visit: Payer: Self-pay

## 2017-06-27 NOTE — Telephone Encounter (Signed)
PA was approved through 06/20/2018.  Approval faxed to pharmacy again.

## 2017-06-27 NOTE — Telephone Encounter (Signed)
Patient called and stated that she was confused due to the pharmacy stating that her medication modafinil (PROVIGIL) 200 MG tablet was filled/ delivered on 10/2. I advised the patient that from our records I see that the medication was sent in 9/19 as a fax. Patient is confused and needs to speak to a nurse. Call patient to advise.

## 2018-03-09 ENCOUNTER — Encounter: Payer: Self-pay | Admitting: Family Medicine

## 2018-03-10 ENCOUNTER — Encounter: Payer: Self-pay | Admitting: Family Medicine

## 2018-03-12 ENCOUNTER — Other Ambulatory Visit: Payer: Self-pay

## 2018-03-12 ENCOUNTER — Encounter (HOSPITAL_COMMUNITY): Payer: Self-pay | Admitting: Emergency Medicine

## 2018-03-12 ENCOUNTER — Ambulatory Visit (HOSPITAL_COMMUNITY)
Admission: EM | Admit: 2018-03-12 | Discharge: 2018-03-12 | Disposition: A | Discharge status: Home / Self Care | Payer: Managed Care, Other (non HMO) | Attending: Family Medicine | Admitting: Family Medicine

## 2018-03-12 DIAGNOSIS — L0231 Cutaneous abscess of buttock: Secondary | ICD-10-CM

## 2018-03-12 DIAGNOSIS — L0291 Cutaneous abscess, unspecified: Secondary | ICD-10-CM

## 2018-03-12 MED ORDER — SULFAMETHOXAZOLE-TRIMETHOPRIM 800-160 MG PO TABS: 1.0000 | ORAL_TABLET | Freq: Two times a day (BID) | ORAL | 0 refills | Status: DC

## 2018-03-12 MED ORDER — HYDROCODONE-ACETAMINOPHEN 5-325 MG PO TABS: 1.0000 | ORAL_TABLET | Freq: Four times a day (QID) | ORAL | 0 refills | Status: AC | PRN

## 2018-03-12 NOTE — ED Provider Notes (Signed)
[facility]    CSN: [id number] Arrival date & time: 03/12/18  1954     History   Chief Complaint Chief Complaint  Patient presents with  . Abscess    HPI Holly Johns is a 27 y.o. female.    Abscess  Abscess location: Tailbone. Size:  9 cm Abscess quality: fluctuance and painful   Red streaking: no   Duration:  3 days Progression:  Worsening Pain details:    Quality:  Unable to specify   Severity:  Moderate   Duration:  3 days   Timing:  Constant   Progression:  Worsening Chronicity:  New Context: not diabetes and not skin injury   Relieved by:  Nothing Ineffective treatments:  None tried   Past Medical History:  Diagnosis Date  . Allergy   . Depression     Patient Active Problem List   Diagnosis Date Noted  . Dysthymia 06/12/2017  . Shift work sleep disorder 06/12/2017    History reviewed. No pertinent surgical history.  OB History   None      Home Medications    Prior to Admission medications   Medication Sig Start Date End Date Taking? Authorizing Provider  EPINEPHrine 0.3 mg/0.3 mL IJ SOAJ injection Inject 0.3 mg into the muscle daily as needed (allergic reaction).    [provider]  HYDROcodone-acetaminophen (NORCO/VICODIN) 5-325 MG tablet Take 1 tablet by mouth every 6 (six) hours as needed for up to 5 days. 03/12/18 03/17/18  Lucia Estelle, NP  sulfamethoxazole-trimethoprim (BACTRIM DS,SEPTRA DS) 800-160 MG tablet Take 1 tablet by mouth 2 (two) times daily for 7 days. 03/12/18 03/19/18  Lucia Estelle, NP    Family History Family History  Problem Relation Age of Onset  . Diabetes Mother   . Stroke Mother   . Diabetes Father     Social History Social History   Tobacco Use  . Smoking status: Never Smoker  . Smokeless tobacco: Never Used  Substance Use Topics  . Alcohol use: No  . Drug use: No     Allergies   Peanuts [peanut oil]   Review of Systems Review of Systems  Constitutional:       See HPI      Physical Exam Triage Vital Signs ED Triage Vitals  Enc Vitals Group     BP 03/12/18 2006 127/72     Pulse Rate 03/12/18 2006 (!) 111     Resp 03/12/18 2006 18     Temp 03/12/18 2006 98.8 F (37.1 C)     Temp Source 03/12/18 2006 Oral     SpO2 03/12/18 2006 100 %     Weight --      Height --      Head Circumference --      Peak Flow --      Pain Score 03/12/18 2007 7     Pain Loc --      Pain Edu? --      Excl. in GC? --    No data found.  Updated Vital Signs BP 127/72 (BP Location: Right Arm)   Pulse (!) 111   Temp 98.8 F (37.1 C) (Oral)   Resp 18   SpO2 100%   Physical Exam  Constitutional: She is oriented to person, place, and time. She appears well-developed and well-nourished.  Cardiovascular: Normal rate.  Pulmonary/Chest: Effort normal.  Neurological: She is alert and oriented to person, place, and time.  Skin: Skin is warm and dry.  Has an abscess  on tailbone area with 2 cm of fluctuance surrounded by large area of induration extending the abscess to a total of 9 cm. No streaking. Tender to palpate.    Nursing note and vitals reviewed.    UC Treatments / Results  Labs (all labs ordered are listed, but only abnormal results are displayed) Labs Reviewed - No data to display  EKG None  Radiology No results found.  Procedures Incision and Drainage Date/Time: 03/12/2018 8:42 PM Performed by: Lucia EstelleZheng, Aavya Shafer, NP Authorized by: Elvina SidleLauenstein, Kurt, MD   Consent:    Consent obtained:  Verbal   Consent given by:  Patient   Alternatives discussed:  Alternative treatment Location:    Type:  Abscess   Size:  9 cm with tunneling   Location:  Anogenital   Anogenital location:  Pilonidal Pre-procedure details:    Skin preparation:  Betadine Procedure type:    Complexity:  Simple Procedure details:    Needle aspiration: no     Incision types:  Stab incision   Incision depth:  Dermal   Scalpel blade:  11   Wound management:  Probed and deloculated    Drainage:  Purulent and bloody   Drainage amount:  Copious   Packing materials:  1/4 in iodoform gauze Comments:     Couldn't pack it well due to patient's intolerance.    (including critical care time)  Medications Ordered in UC Medications - No data to display  Initial Impression / Assessment and Plan / UC Course  I have reviewed the triage vital signs and the nursing notes.  Pertinent labs & imaging results that were available during my care of the patient were reviewed by me and considered in my medical decision making (see chart for details).  Final Clinical Impressions(s) / UC Diagnoses   Final diagnoses:  Abscess  Incision and Drainage performed. Patient tolerated fair. RX for hydrocodone-APAP and bactrim given. Please return tomorrow for wound re-evaluation and packing removal. See procedure note above.   Discharge Instructions   None    ED Prescriptions    Medication Sig Dispense Auth. Provider   sulfamethoxazole-trimethoprim (BACTRIM DS,SEPTRA DS) 800-160 MG tablet Take 1 tablet by mouth 2 (two) times daily for 7 days. 14 tablet Lucia EstelleZheng, Dovid Bartko, NP   HYDROcodone-acetaminophen (NORCO/VICODIN) 5-325 MG tablet Take 1 tablet by mouth every 6 (six) hours as needed for up to 5 days. 15 tablet Lucia EstelleZheng, Ronaldo Crilly, NP     Controlled Substance Prescriptions [place] Controlled Substance Registry consulted? Yes   Lucia EstelleZheng, Emilie Carp, NP 03/12/18 2048

## 2018-03-12 NOTE — ED Triage Notes (Signed)
[facility] a [facility] possible abscess on her tail bone area x 3 days.

## 2018-03-13 ENCOUNTER — Ambulatory Visit: Payer: Managed Care, Other (non HMO) | Admitting: Family Medicine

## 2018-03-13 ENCOUNTER — Ambulatory Visit (HOSPITAL_COMMUNITY)
Admission: EM | Admit: 2018-03-13 | Discharge: 2018-03-13 | Disposition: A | Discharge status: Home / Self Care | Payer: Managed Care, Other (non HMO) | Attending: Family Medicine | Admitting: Family Medicine

## 2018-03-13 ENCOUNTER — Encounter (HOSPITAL_COMMUNITY): Payer: Self-pay | Admitting: Emergency Medicine

## 2018-03-13 DIAGNOSIS — Z09 Encounter for follow-up examination after completed treatment for conditions other than malignant neoplasm: Secondary | ICD-10-CM

## 2018-03-13 DIAGNOSIS — L0231 Cutaneous abscess of buttock: Secondary | ICD-10-CM

## 2018-03-13 NOTE — Discharge Instructions (Signed)
Continue with warm compresses to promote drainage. Remove packing tomorrow night or Wednesday.  Cleanse area with soap and water daily following packing removal, keep covered to keep clean and collect drainage. Continue with antibiotics and pain medications as prescribed.  If worsening or no improvement please return here or follow with your PCP. I would expect gradual improvement over the next 1-2 weeks.

## 2018-03-13 NOTE — ED Triage Notes (Signed)
Pt states she was seen here yesterday for abscess on back, tailbone area and told to come back for wound recheck, had it I/D and packed.

## 2018-03-13 NOTE — ED Notes (Signed)
Bed: UC01 Expected date:  Expected time:  Means of arrival:  Comments: 

## 2018-03-13 NOTE — ED Provider Notes (Signed)
[facility]    CSN: [id number] Arrival date & time: 03/13/18  1642     History   Chief Complaint Chief Complaint  Patient presents with  . Appointment    5  . Follow-up    HPI Holly Johns is a 27 y.o. female.   Holly Johns presents for wound recheck and follow up. Had incision and drainage of abscess completed yesterday with packing placed. Pain has improved. Has been taking hydrocodone. Has taken two doses of antibiotics. Has had a large amount of drainage still. No fevers. States feels a little nauseated as took pain medication on an empty stomach. States has an appointment with her PCP later this week. Without contributing medical history.    ROS per HPI.      Past Medical History:  Diagnosis Date  . Allergy   . Depression     Patient Active Problem List   Diagnosis Date Noted  . Dysthymia 06/12/2017  . Shift work sleep disorder 06/12/2017    History reviewed. No pertinent surgical history.  OB History   None      Home Medications    Prior to Admission medications   Medication Sig Start Date End Date Taking? Authorizing Provider  EPINEPHrine 0.3 mg/0.3 mL IJ SOAJ injection Inject 0.3 mg into the muscle daily as needed (allergic reaction).    [provider]  HYDROcodone-acetaminophen (NORCO/VICODIN) 5-325 MG tablet Take 1 tablet by mouth every 6 (six) hours as needed for up to 5 days. 03/12/18 03/17/18  Lucia EstelleZheng, Feng, NP  sulfamethoxazole-trimethoprim (BACTRIM DS,SEPTRA DS) 800-160 MG tablet Take 1 tablet by mouth 2 (two) times daily for 7 days. 03/12/18 03/19/18  Lucia EstelleZheng, Feng, NP    Family History Family History  Problem Relation Age of Onset  . Diabetes Mother   . Stroke Mother   . Diabetes Father     Social History Social History   Tobacco Use  . Smoking status: Never Smoker  . Smokeless tobacco: Never Used  Substance Use Topics  . Alcohol use: No  . Drug use: No     Allergies   Peanuts [peanut oil]   Review of  Systems Review of Systems   Physical Exam Triage Vital Signs ED Triage Vitals [03/13/18 1655]  Enc Vitals Group     BP 114/77     Pulse Rate (!) 105     Resp 16     Temp 98.5 F (36.9 C)     Temp src      SpO2 100 %     Weight      Height      Head Circumference      Peak Flow      Pain Score      Pain Loc      Pain Edu?      Excl. in GC?    No data found.  Updated Vital Signs BP 114/77   Pulse (!) 105   Temp 98.5 F (36.9 C)   Resp 16   SpO2 100%    Physical Exam  Constitutional: She is oriented to person, place, and time. She appears well-developed and well-nourished. No distress.  Cardiovascular: Normal rate, regular rhythm and normal heart sounds.  Pulmonary/Chest: Effort normal and breath sounds normal.  Neurological: She is alert and oriented to person, place, and time.  Skin: Skin is warm and dry.     Gauze dressings saturated with serosanguinous drainage; packing remains in place; with such significant drainage and packing intake opted to  leave in place since has only been 24 hours at this time      UC Treatments / Results  Labs (all labs ordered are listed, but only abnormal results are displayed) Labs Reviewed - No data to display  EKG None  Radiology No results found.  Procedures Procedures (including critical care time)  Medications Ordered in UC Medications - No data to display  Initial Impression / Assessment and Plan / UC Course  I have reviewed the triage vital signs and the nursing notes.  Pertinent labs & imaging results that were available during my care of the patient were reviewed by me and considered in my medical decision making (see chart for details).     Wound appears to be well healing, still with large drainage. Packing left in at this time. Patient states unable to return to clinic in the next 36 hours, discussed that she can remove packing at home. Wound care discussed. She has an appointment with her PCP this week  already. Return precautions provided. Patient verbalized understanding and agreeable to plan.   Final Clinical Impressions(s) / UC Diagnoses   Final diagnoses:  Encounter for recheck of abscess following incision and drainage     Discharge Instructions     Continue with warm compresses to promote drainage. Remove packing tomorrow night or Wednesday.  Cleanse area with soap and water daily following packing removal, keep covered to keep clean and collect drainage. Continue with antibiotics and pain medications as prescribed.  If worsening or no improvement please return here or follow with your PCP. I would expect gradual improvement over the next 1-2 weeks.    ED Prescriptions    None     Controlled Substance Prescriptions [place] Controlled Substance Registry consulted? Not Applicable   Georgetta Haber, NP 03/13/18 1735

## 2018-03-16 NOTE — Progress Notes (Signed)
Holly Johns is a 27 y.o. female is here for follow up.  History of Present Illness:   Holly Johns, CMA acting as scribe for Dr. Helane Johns.   HPI: Patient in for evaluation on abscess on lower back. Seen on Sunday and had I&D. Some discharge in area. Hx of the same 2 years ago. Two days left on Bactrim. Has been sitting more due to clinicals.    Health Maintenance Due  Topic Date Due  . HIV Screening  07/16/2007  . TETANUS/TDAP  07/16/2011  . PAP SMEAR  07/15/2013   Depression screen Holly Johns 2/9 03/17/2018 06/07/2017 03/07/2017  Decreased Interest 0 2 2  Down, Depressed, Hopeless 1 3 2   PHQ - 2 Score 1 5 4   Altered sleeping 0 3 3  Tired, decreased energy 0 3 2  Change in appetite 0 3 3  Feeling bad or failure about yourself  0 1 0  Trouble concentrating 1 2 3   Moving slowly or fidgety/restless 0 2 2  Suicidal thoughts 0 0 0  PHQ-9 Score 2 19 17   Difficult doing work/chores Not difficult at all Very difficult -   PMHx, SurgHx, SocialHx, FamHx, Medications, and Allergies were reviewed in the Visit Navigator and updated as appropriate.   Patient Active Problem List   Diagnosis Date Noted  . Dysthymia 06/12/2017  . Shift work sleep disorder 06/12/2017   Social History   Tobacco Use  . Smoking status: Never Smoker  . Smokeless tobacco: Never Used  Substance Use Topics  . Alcohol use: No  . Drug use: No   Current Medications and Allergies:   Current Outpatient Medications:  .  EPINEPHrine 0.3 mg/0.3 mL IJ SOAJ injection, Inject 0.3 mg into the muscle daily as needed (allergic reaction)., Disp: , Rfl:  .  HYDROcodone-acetaminophen (NORCO/VICODIN) 5-325 MG tablet, Take 1 tablet by mouth every 6 (six) hours as needed for up to 5 days., Disp: 15 tablet, Rfl: 0 .  sulfamethoxazole-trimethoprim (BACTRIM DS,SEPTRA DS) 800-160 MG tablet, Take 1 tablet by mouth 2 (two) times daily., Disp: 20 tablet, Rfl: 0   Allergies  Allergen Reactions  . Peanuts [Peanut Oil] Hives  and Itching   Review of Systems   Pertinent items are noted in the HPI. Otherwise, ROS is negative.  Vitals:   Vitals:   03/17/18 0820  BP: 110/68  Pulse: 83  Temp: 98.6 F (37 C)  TempSrc: Oral  SpO2: 97%  Weight: 138 lb 9.6 oz (62.9 kg)  Height: 5\' 5"  (1.651 m)     Body mass index is 23.06 kg/m.  Physical Exam:   Physical Exam  Constitutional: She appears well-nourished.  HENT:  Head: Normocephalic and atraumatic.  Eyes: Pupils are equal, round, and reactive to light. EOM are normal.  Neck: Normal range of motion. Neck supple.  Cardiovascular: Normal rate, regular rhythm, normal heart sounds and intact distal pulses.  Pulmonary/Chest: Effort normal.  Abdominal: Soft.  Skin: Skin is warm.  Healing pilonidal cyst with infection.  Psychiatric: She has a normal mood and affect. Her behavior is normal.  Nursing note and vitals reviewed.  Results for orders placed or performed in visit on 03/07/17  CBC  Result Value Ref Range   WBC 8.1 4.0 - 10.5 K/uL   RBC 4.76 3.87 - 5.11 Mil/uL   Platelets 408.0 (H) 150.0 - 400.0 K/uL   Hemoglobin 11.1 (L) 12.0 - 15.0 g/dL   HCT 81.1 (L) 91.4 - 78.2 %   MCV 72.6 (L) 78.0 -  100.0 fl   MCHC 32.0 30.0 - 36.0 g/dL   RDW 16.116.7 (H) 09.611.5 - 04.515.5 %  Comprehensive metabolic panel  Result Value Ref Range   Sodium 135 135 - 145 mEq/L   Potassium 4.0 3.5 - 5.1 mEq/L   Chloride 101 96 - 112 mEq/L   CO2 28 19 - 32 mEq/L   Glucose, Bld 85 70 - 99 mg/dL   BUN 14 6 - 23 mg/dL   Creatinine, Ser 4.090.69 0.40 - 1.20 mg/dL   Total Bilirubin 0.2 0.2 - 1.2 mg/dL   Alkaline Phosphatase 78 39 - 117 U/L   AST 19 0 - 37 U/L   ALT 10 0 - 35 U/L   Total Protein 8.5 (H) 6.0 - 8.3 g/dL   Albumin 4.4 3.5 - 5.2 g/dL   Calcium 9.9 8.4 - 81.110.5 mg/dL   GFR 914.78133.71 >29.56>60.00 mL/min  TSH  Result Value Ref Range   TSH 2.71 0.35 - 4.50 uIU/mL  PPD  Result Value Ref Range   TB Skin Test Negative    Induration 0.00 mm    Assessment and Plan:   Holly Johns was  seen today for abscess.  Diagnoses and all orders for this visit:  Pilonidal cyst Comments: Healing. Finish Abx. She would like to hold on General Surgery referral.  Orders: -     Discontinue: sulfamethoxazole-trimethoprim (BACTRIM DS,SEPTRA DS) 800-160 MG tablet; Take 1 tablet by mouth 2 (two) times daily. -     sulfamethoxazole-trimethoprim (BACTRIM DS,SEPTRA DS) 800-160 MG tablet; Take 1 tablet by mouth 2 (two) times daily.    . Reviewed expectations re: course of current medical issues. . Discussed self-management of symptoms. . Outlined signs and symptoms indicating need for more acute intervention. . Patient verbalized understanding and all questions were answered. Marland Kitchen. Health Maintenance issues including appropriate healthy diet, exercise, and smoking avoidance were discussed with patient. . See orders for this visit as documented in the electronic medical record. . Patient received an After Visit Summary.  Holly RimaErica Trusten Hume, DO Holly Johns, [facility] 03/17/2018  Future Appointments  Date Time Provider Department Center  04/26/2018  7:40 AM Holly Johns, Holly Lavallee, DO [facility]    CMA served as scribe during this visit. History, Physical, and Plan performed by medical provider. The above documentation has been reviewed and is accurate and complete. Holly RimaErica Wonder Johns, D.O.

## 2018-03-17 ENCOUNTER — Ambulatory Visit: Payer: Managed Care, Other (non HMO) | Admitting: Family Medicine

## 2018-03-17 ENCOUNTER — Encounter: Payer: Self-pay | Admitting: Family Medicine

## 2018-03-17 VITALS — BP 110/68 | HR 83 | Temp 98.6°F | Ht 65.0 in | Wt 138.6 lb

## 2018-03-17 DIAGNOSIS — L0591 Pilonidal cyst without abscess: Secondary | ICD-10-CM | POA: Diagnosis not present

## 2018-03-17 MED ORDER — SULFAMETHOXAZOLE-TRIMETHOPRIM 800-160 MG PO TABS: 1.0000 | ORAL_TABLET | Freq: Two times a day (BID) | ORAL | 0 refills | Status: DC

## 2018-04-26 ENCOUNTER — Encounter: Payer: Managed Care, Other (non HMO) | Admitting: Family Medicine

## 2018-06-27 ENCOUNTER — Ambulatory Visit (INDEPENDENT_AMBULATORY_CARE_PROVIDER_SITE_OTHER): Payer: Managed Care, Other (non HMO) | Admitting: Family Medicine

## 2018-06-27 ENCOUNTER — Encounter: Payer: Self-pay | Admitting: Family Medicine

## 2018-06-27 VITALS — BP 118/80 | HR 74 | Temp 98.2°F | Ht 65.0 in | Wt 141.0 lb

## 2018-06-27 DIAGNOSIS — Z0000 Encounter for general adult medical examination without abnormal findings: Secondary | ICD-10-CM | POA: Diagnosis not present

## 2018-06-27 DIAGNOSIS — Z13220 Encounter for screening for lipoid disorders: Secondary | ICD-10-CM

## 2018-06-27 DIAGNOSIS — Z23 Encounter for immunization: Secondary | ICD-10-CM

## 2018-06-27 DIAGNOSIS — R5383 Other fatigue: Secondary | ICD-10-CM

## 2018-06-27 LAB — LIPID PANEL
Cholesterol: 87 mg/dL (ref 0–200)
HDL: 40.1 mg/dL (ref >39.00)
LDL Cholesterol: 40 mg/dL (ref 0–99)
NonHDL: 46.48
Total CHOL/HDL Ratio: 2
Triglycerides: 33 mg/dL (ref 0.0–149.0)
VLDL: 6.6 mg/dL (ref 0.0–40.0)

## 2018-06-27 LAB — CBC WITH DIFFERENTIAL/PLATELET
Basophils Absolute: 0 10*3/uL (ref 0.0–0.1)
Basophils Relative: 0.2 % (ref 0.0–3.0)
Eosinophils Absolute: 0 10*3/uL (ref 0.0–0.7)
Eosinophils Relative: 0.2 % (ref 0.0–5.0)
HCT: 32.5 % — ABNORMAL LOW (ref 36.0–46.0)
Hemoglobin: 10.3 g/dL — ABNORMAL LOW (ref 12.0–15.0)
Lymphocytes Relative: 20.3 % (ref 12.0–46.0)
Lymphs Abs: 1.4 10*3/uL (ref 0.7–4.0)
MCHC: 31.8 g/dL (ref 30.0–36.0)
MCV: 72 fl — ABNORMAL LOW (ref 78.0–100.0)
Monocytes Absolute: 0.4 10*3/uL (ref 0.1–1.0)
Monocytes Relative: 5.7 % (ref 3.0–12.0)
Neutro Abs: 5.2 10*3/uL (ref 1.4–7.7)
Neutrophils Relative %: 73.6 % (ref 43.0–77.0)
Platelets: 375 10*3/uL (ref 150.0–400.0)
RBC: 4.51 Mil/uL (ref 3.87–5.11)
RDW: 16.4 % — ABNORMAL HIGH (ref 11.5–15.5)
WBC: 7.1 10*3/uL (ref 4.0–10.5)

## 2018-06-27 LAB — COMPREHENSIVE METABOLIC PANEL
ALT: 8 U/L (ref 0–35)
AST: 16 U/L (ref 0–37)
Albumin: 4.1 g/dL (ref 3.5–5.2)
Alkaline Phosphatase: 66 U/L (ref 39–117)
BUN: 11 mg/dL (ref 6–23)
CO2: 28 mEq/L (ref 19–32)
Calcium: 9.5 mg/dL (ref 8.4–10.5)
Chloride: 103 mEq/L (ref 96–112)
Creatinine, Ser: 0.72 mg/dL (ref 0.40–1.20)
GFR: 125.97 mL/min (ref >60.00)
Glucose, Bld: 90 mg/dL (ref 70–99)
Potassium: 4 mEq/L (ref 3.5–5.1)
Sodium: 137 mEq/L (ref 135–145)
Total Bilirubin: 0.3 mg/dL (ref 0.2–1.2)
Total Protein: 8.5 g/dL — ABNORMAL HIGH (ref 6.0–8.3)

## 2018-06-27 NOTE — Progress Notes (Signed)
Subjective:    Holly Johns is a 26 y.o. female and is here for a comprehensive physical exam.  Current Outpatient Medications:  .  EPINEPHrine 0.3 mg/0.3 mL IJ SOAJ injection, Inject 0.3 mg into the muscle daily as needed (allergic reaction)., Disp: , Rfl:   Health Maintenance Due  Topic Date Due  . HIV Screening  07/16/2007  . TETANUS/TDAP  07/16/2011  . PAP SMEAR  07/15/2013  . INFLUENZA VACCINE  04/20/2018   Depression screen [facility] 2/9 06/27/2018 03/17/2018 06/07/2017 03/07/2017  Decreased Interest 0 0 2 2  Down, Depressed, Hopeless 1 1 3 2   PHQ - 2 Score 1 1 5 4   Altered sleeping 1 0 3 3  Tired, decreased energy - 0 3 2  Change in appetite 2 0 3 3  Feeling bad or failure about yourself  0 0 1 0  Trouble concentrating 0 1 2 3   Moving slowly or fidgety/restless - 0 2 2  Suicidal thoughts 0 0 0 0  PHQ-9 Score 4 2 19 17   Difficult doing work/chores Not difficult at all Not difficult at all Very difficult -   PMHx, SurgHx, SocialHx, Medications, and Allergies were reviewed in the Visit Navigator and updated as appropriate.   Past Medical History:  Diagnosis Date  . Allergy   . Depression    History reviewed. No pertinent surgical history.   Family History  Problem Relation Age of Onset  . Diabetes Mother   . Stroke Mother   . Diabetes Father    Social History   Tobacco Use  . Smoking status: Never Smoker  . Smokeless tobacco: Never Used  Substance Use Topics  . Alcohol use: No  . Drug use: No   Review of Systems:   Pertinent items are noted in the HPI. Otherwise, ROS is negative.  Objective:   BP 118/80 (BP Location: Left Arm, Patient Position: Sitting, Cuff Size: Normal)   Pulse 74   Temp 98.2 F (36.8 C) (Oral)   Ht 5\' 5"  (1.651 m)   Wt 141 lb (64 kg)   LMP 06/25/2018   SpO2 100%   BMI 23.46 kg/m    General appearance: alert, cooperative and appears stated age. Head: normocephalic, without obvious abnormality, atraumatic. Neck: no adenopathy,  supple, symmetrical, trachea midline; thyroid not enlarged, symmetric, no tenderness/mass/nodules. Lungs: clear to auscultation bilaterally. Heart: regular rate and rhythm Abdomen: soft, non-tender; no masses,  no organomegaly. Extremities: extremities normal, atraumatic, no cyanosis or edema. Skin: skin color, texture, turgor normal, no rashes or lesions. Lymph: cervical, supraclavicular, and axillary nodes normal; no abnormal inguinal nodes palpated. Neurologic: grossly normal.  Assessment/Plan:   Holly Johns was seen today for annual exam.  Diagnoses and all orders for this visit:  Routine physical examination  Need for immunization against influenza -     Flu Vaccine QUAD 36+ mos IM  Need for prophylactic vaccination against diphtheria-tetanus-pertussis (DTP) -     Tdap vaccine greater than or equal to 7yo IM  Screening for lipid disorders -     Lipid panel  Fatigue, unspecified type -     CBC with Differential/Platelet -     Comprehensive metabolic panel   Patient Counseling:   [x]     Nutrition: Stressed importance of moderation in sodium/caffeine intake, saturated fat and cholesterol, caloric balance, sufficient intake of fresh fruits, vegetables, fiber, calcium, iron, and 1 mg of folate supplement per day (for females capable of pregnancy).   [x]      Stressed the importance  of regular exercise.    [x]     Substance Abuse: Discussed cessation/primary prevention of tobacco, alcohol, or other drug use; driving or other dangerous activities under the influence; availability of treatment for abuse.    [x]      Injury prevention: Discussed safety belts, safety helmets, smoke detector, smoking near bedding or upholstery.    [x]      Sexuality: Discussed sexually transmitted diseases, partner selection, use of condoms, avoidance of unintended pregnancy  and contraceptive alternatives.    [x]     Dental health: Discussed importance of regular tooth brushing, flossing, and  dental visits.   [x]      Health maintenance and immunizations reviewed. Please refer to Health maintenance section.   Helane Rima, DO [facility]

## 2018-06-30 ENCOUNTER — Other Ambulatory Visit: Payer: Self-pay | Admitting: Family Medicine

## 2018-06-30 ENCOUNTER — Telehealth: Payer: Self-pay | Admitting: Family Medicine

## 2018-06-30 DIAGNOSIS — R5383 Other fatigue: Secondary | ICD-10-CM

## 2018-06-30 NOTE — Telephone Encounter (Signed)
Copied from CRM [id number]. Topic: Quick Communication - Lab Results (Clinic Use ONLY) >> Jun 29, 2018  9:33 AM Donnamarie Poag, CMA wrote: Called patient to inform them of 06/29/2018 lab results. When patient returns call, triage nurse may disclose results.   Documented in result notes.

## 2018-09-27 DIAGNOSIS — F341 Dysthymic disorder: Secondary | ICD-10-CM | POA: Diagnosis not present

## 2018-10-03 ENCOUNTER — Other Ambulatory Visit: Payer: Managed Care, Other (non HMO)

## 2018-10-10 ENCOUNTER — Encounter: Payer: Self-pay | Admitting: Family Medicine

## 2018-10-10 ENCOUNTER — Other Ambulatory Visit (HOSPITAL_COMMUNITY)
Admission: RE | Admit: 2018-10-10 | Discharge: 2018-10-10 | Disposition: A | Discharge status: Home / Self Care | Payer: BLUE CROSS/BLUE SHIELD | Source: Ambulatory Visit | Attending: Family Medicine | Admitting: Family Medicine

## 2018-10-10 ENCOUNTER — Ambulatory Visit (INDEPENDENT_AMBULATORY_CARE_PROVIDER_SITE_OTHER): Payer: BLUE CROSS/BLUE SHIELD | Admitting: Family Medicine

## 2018-10-10 VITALS — BP 110/78 | HR 96 | Temp 98.5°F | Ht 65.0 in | Wt 151.0 lb

## 2018-10-10 DIAGNOSIS — Z114 Encounter for screening for human immunodeficiency virus [HIV]: Secondary | ICD-10-CM | POA: Diagnosis not present

## 2018-10-10 DIAGNOSIS — Z124 Encounter for screening for malignant neoplasm of cervix: Secondary | ICD-10-CM | POA: Diagnosis not present

## 2018-10-10 DIAGNOSIS — Z23 Encounter for immunization: Secondary | ICD-10-CM

## 2018-10-10 DIAGNOSIS — R7989 Other specified abnormal findings of blood chemistry: Secondary | ICD-10-CM

## 2018-10-10 LAB — CBC WITH DIFFERENTIAL/PLATELET
Basophils Absolute: 0 10*3/uL (ref 0.0–0.1)
Basophils Relative: 0.4 % (ref 0.0–3.0)
Eosinophils Absolute: 0.1 10*3/uL (ref 0.0–0.7)
Eosinophils Relative: 0.9 % (ref 0.0–5.0)
HCT: 34.4 % — ABNORMAL LOW (ref 36.0–46.0)
Hemoglobin: 11.2 g/dL — ABNORMAL LOW (ref 12.0–15.0)
Lymphocytes Relative: 25.9 % (ref 12.0–46.0)
Lymphs Abs: 1.9 10*3/uL (ref 0.7–4.0)
MCHC: 32.5 g/dL (ref 30.0–36.0)
MCV: 73.3 fl — ABNORMAL LOW (ref 78.0–100.0)
Monocytes Absolute: 0.5 10*3/uL (ref 0.1–1.0)
Monocytes Relative: 6.5 % (ref 3.0–12.0)
Neutro Abs: 4.8 10*3/uL (ref 1.4–7.7)
Neutrophils Relative %: 66.3 % (ref 43.0–77.0)
Platelets: 359 10*3/uL (ref 150.0–400.0)
RBC: 4.69 Mil/uL (ref 3.87–5.11)
RDW: 17.8 % — ABNORMAL HIGH (ref 11.5–15.5)
WBC: 7.2 10*3/uL (ref 4.0–10.5)

## 2018-10-10 NOTE — Patient Instructions (Signed)
Vaccine Information Statement    HPV (Human Papillomavirus) Vaccine: What You Need to Know    Many Vaccine Information Statements are available in Spanish and other languages. See www.immunize.org/vis.  Hojas de Informacin Sobre Vacunas estn disponibles en espaol y en muchos otros idiomas. Visite http://www.immunize.org/vis.    1. Why get vaccinated?    HPV vaccine prevents infection with human papillomavirus (HPV) types that are associated with many cancers, including:    . cervical cancer in females,  . vaginal and vulvar cancers in females,   . anal cancer in females and males,  . throat cancer in females and males, and  . penile cancer in males.     In addition, HPV vaccine prevents infection with HPV types that cause genital warts in both females and males.    In the [place] about 12,000 women get cervical cancer every year, and about 4,000 women die from it. HPV vaccine can prevent most of these cases of cervical cancer.    Vaccination is not a substitute for cervical cancer screening. This vaccine does not protect against all HPV types that can cause cervical cancer.  Women should still get regular Pap tests.    HPV infection usually comes from sexual contact, and most people will become infected at some point in their life. About 14 million Americans, including teens, get infected every year.  Most infections will go away on their own and not cause serious problems. But thousands of women and men get cancer and other diseases from HPV.       2. HPV vaccine    HPV vaccine is approved by FDA and is recommended by CDC for both males and females. It is routinely given at 11 or 27 years of age, but it may be given beginning at age 9 years through age 26 years.      Most adolescents 9 through 27 years of age should get HPV vaccine as a two-dose series with the doses separated by 6-12 months. People who start HPV vaccination at 15 years of age and older should get the vaccine as a three-dose series with the  second dose given 1-2 months after the first dose and the third dose given 6 months after the first dose. There are several exceptions to these age recommendations. Your health care provider can give you more information.           3. Some people should not get this vaccine:    . Anyone who has had a severe (life-threatening) allergic reaction to a dose of HPV vaccine should not get another dose.     . Anyone who has a severe (life threatening) allergy to any component of HPV vaccine should not get the vaccine.      Tell your doctor if you have any severe allergies that you know of, including a severe allergy to yeast.    . HPV vaccine is not recommended for pregnant women. If you learn that you were pregnant when you were vaccinated, there is no reason to expect any problems for you or your baby. Any woman who learns she was pregnant when she got HPV vaccine is encouraged to contact the manufacturer's registry for HPV vaccination during pregnancy at 1-800-986-8999. Women who are breastfeeding may be vaccinated.     . If you have a mild illness, such as a cold, you can probably get the vaccine today. If you are moderately or severely ill, you should probably wait until you recover. Your doctor can   advise you.      4. Risks of a vaccine reaction    With any medicine, including vaccines, there is a chance of side effects. These are usually mild and go away on their own, but serious reactions are also possible.     Most people who get HPV vaccine do not have any serious problems with it.       Mild or moderate problems following HPV vaccine:    . Reactions in the arm where the shot was given:  - Soreness (about 9 people in 10)  - Redness or swelling (about 1 person in 3)    . Fever:  - Mild (100F) (about 1 person in 10)  - Moderate (102F) (about 1 person in 65)    . Other problems:  - Headache (about 1 person in 3)    Problems that could happen after any injected vaccine:    . People sometimes faint after a medical  procedure, including vaccination. Sitting or lying down for about 15 minutes can help prevent fainting and injuries caused by a fall. Tell your doctor if you feel dizzy, or have vision changes or ringing in the ears.    . Some people get severe pain in the shoulder and have difficulty moving the arm where a shot was given. This happens very rarely.    . Any medication can cause a severe allergic reaction. Such reactions from a vaccine are very rare, estimated at about 1 in a million doses, and would happen within a few minutes to a few hours after the vaccination.     As with any medicine, there is a very remote chance of a vaccine causing a serious injury or death.    The safety of vaccines is always being monitored.  For more information, visit: www.cdc.gov/vaccinesafety/.      5. What if there is a serious reaction?    What should I look for?    Look for anything that concerns you, such as signs of a severe allergic reaction, very high fever, or unusual behavior.    Signs of a severe allergic reaction can include hives, swelling of the face and throat, difficulty breathing, a fast heartbeat, dizziness, and weakness. These would usually start a few minutes to a few hours after the vaccination.    What should I do?    If you think it is a severe allergic reaction or other emergency that can't wait, call 9-1-1 or get to the nearest hospital. Otherwise, call your doctor.    Afterward, the reaction should be reported to the Vaccine Adverse Event Reporting System (VAERS). Your doctor should file this report, or you can do it yourself through the VAERS web site at www.vaers.hhs.gov, or by calling 1-800-822-7967.    VAERS does not give medical advice.      6. The National Vaccine Injury Compensation Program    The National Vaccine Injury Compensation Program (VICP) is a federal program that was created to compensate people who may have been injured by certain vaccines.    Persons who believe they may have been injured by  a vaccine can learn about the program and about filing a claim by calling 1-800-338-2382 or visiting the VICP website at www.hrsa.gov/vaccinecompensation. There is a time limit to file a claim for compensation.      7. How can I learn more?    . Ask your health care provider.  He or she can give you the vaccine package insert or suggest   other sources of information.  . Call your local or state health department.  . Contact the Centers for Disease Control and Prevention (CDC):  - Call 1-800-232-4636 (1-800-CDC-INFO) or  - Visit CDC's website at www.cdc.gov/hpv    Vaccine Information Statement   HPV Vaccine 08/22/2015  42 U.S.C.  [id number]    Department of Health and Human Services  Centers for Disease Control and Prevention    Office Use Only

## 2018-10-10 NOTE — Progress Notes (Signed)
Holly Johns is a 28 y.o. female here for an acute visit.  History of Present Illness:   Cleda Daub, DO, acting as a scribe for Helane Rima, DO, have documented all relevant documentation on the behalf of Helane Rima, DO, as directed by  Helane Rima, DO while in the presence of Helane Rima, DO.  HPI: Patient in office today for pap. She would like to receive HPV vaccine. Due for labs as well.  PMHx, SurgHx, SocialHx, Medications, and Allergies were reviewed in the Visit Navigator and updated as appropriate.  Current Medications:   Current Outpatient Medications:  .  EPINEPHrine 0.3 mg/0.3 mL IJ SOAJ injection, Inject 0.3 mg into the muscle daily as needed (allergic reaction)., Disp: , Rfl:    Allergies  Allergen Reactions  . Peanuts [Peanut Oil] Hives and Itching   Review of Systems:   Pertinent items are noted in the HPI. Otherwise, ROS is negative.  Vitals:   Vitals:   10/10/18 0807  BP: 110/78  Pulse: 96  Temp: 98.5 F (36.9 C)  TempSrc: Oral  SpO2: 99%  Weight: 151 lb (68.5 kg)  Height: 5\' 5"  (1.651 m)     Body mass index is 25.13 kg/m.  Physical Exam:   BP 110/78   Pulse 96   Temp 98.5 F (36.9 C) (Oral)   Ht 5\' 5"  (1.651 m)   Wt 151 lb (68.5 kg)   SpO2 99%   BMI 25.13 kg/m   Wt Readings from Last 3 Encounters:  10/10/18 151 lb (68.5 kg)  06/27/18 141 lb (64 kg)  03/17/18 138 lb 9.6 oz (62.9 kg)     Ht Readings from Last 3 Encounters:  10/10/18 5\' 5"  (1.651 m)  06/27/18 5\' 5"  (1.651 m)  03/17/18 5\' 5"  (1.651 m)    General appearance: alert, cooperative and appears stated age.  Pelvic:  External genitalia: no lesions.              Urethra: normal appearing urethra with no masses, tenderness or lesions.              Bartholins and Skenes: normal.               Vagina: normal appearing vagina with normal color and discharge, no lesions.              Cervix: normal appearance.              Pap done: Yes.     Assessment and  Plan:   Holly was seen today for follow-up.  Diagnoses and all orders for this visit:  Pap smear for cervical cancer screening -     Cytology - PAP  Screening for HIV (human immunodeficiency virus) -     HIV Antibody (routine testing w rflx)  Abnormal CBC -     CBC with Differential/Platelet  Need for HPV vaccination -     HPV 9-valent vaccine,Recombinat    . Reviewed expectations re: course of current medical issues. . Discussed self-management of symptoms. . Outlined signs and symptoms indicating need for more acute intervention. . Patient verbalized understanding and all questions were answered. Marland Kitchen Health Maintenance issues including appropriate healthy diet, exercise, and smoking avoidance were discussed with patient. . See orders for this visit as documented in the electronic medical record. . Patient received an After Visit Summary.  CMA served as Neurosurgeon during this visit. History, Physical, and Plan performed by medical provider. The above documentation has been reviewed and is  accurate and complete. Helane Rima, D.O.  Helane Rima, DO [place], [facility] 10/10/2018

## 2018-10-10 NOTE — Progress Notes (Incomplete)
 Holly Johns is a 28 y.o. female here for an acute visit.  History of Present Illness:   I,Holly Wallace, DO, acting as a scribe for Holly Wallace, DO, have documented all relevant documentation on the behalf of Holly Wallace, DO, as directed by  Holly Wallace, DO while in the presence of Holly Wallace, DO.  HPI: Patient in office today for pap. She would like to receive HPV vaccine. Due for labs as well.  PMHx, SurgHx, SocialHx, Medications, and Allergies were reviewed in the Visit Navigator and updated as appropriate.  Current Medications:   Current Outpatient Medications:  .  EPINEPHrine 0.3 mg/0.3 mL IJ SOAJ injection, Inject 0.3 mg into the muscle daily as needed (allergic reaction)., Disp: , Rfl:    Allergies  Allergen Reactions  . Peanuts [Peanut Oil] Hives and Itching   Review of Systems:   Pertinent items are noted in the HPI. Otherwise, ROS is negative.  Vitals:   Vitals:   10/10/18 0807  BP: 110/78  Pulse: 96  Temp: 98.5 F (36.9 C)  TempSrc: Oral  SpO2: 99%  Weight: 151 lb (68.5 kg)  Height: 5' 5" (1.651 m)     Body mass index is 25.13 kg/m.  Physical Exam:   BP 110/78   Pulse 96   Temp 98.5 F (36.9 C) (Oral)   Ht 5' 5" (1.651 m)   Wt 151 lb (68.5 kg)   SpO2 99%   BMI 25.13 kg/m   Wt Readings from Last 3 Encounters:  10/10/18 151 lb (68.5 kg)  06/27/18 141 lb (64 kg)  03/17/18 138 lb 9.6 oz (62.9 kg)     Ht Readings from Last 3 Encounters:  10/10/18 5' 5" (1.651 m)  06/27/18 5' 5" (1.651 m)  03/17/18 5' 5" (1.651 m)    General appearance: alert, cooperative and appears stated age.  Pelvic:  External genitalia: no lesions.              Urethra: normal appearing urethra with no masses, tenderness or lesions.              Bartholins and Skenes: normal.               Vagina: normal appearing vagina with normal color and discharge, no lesions.              Cervix: normal appearance.              Pap done: Yes.     Assessment and  Plan:   [name] was seen today for follow-up.  Diagnoses and all orders for this visit:  Pap smear for cervical cancer screening -     Cytology - PAP  Screening for HIV (human immunodeficiency virus) -     HIV Antibody (routine testing w rflx)  Abnormal CBC -     CBC with Differential/Platelet  Need for HPV vaccination -     HPV 9-valent vaccine,Recombinat    . Reviewed expectations re: course of current medical issues. . Discussed self-management of symptoms. . Outlined signs and symptoms indicating need for more acute intervention. . Patient verbalized understanding and all questions were answered. . Health Maintenance issues including appropriate healthy diet, exercise, and smoking avoidance were discussed with patient. . See orders for this visit as documented in the electronic medical record. . Patient received an After Visit Summary.  CMA served as scribe during this visit. History, Physical, and Plan performed by medical provider. The above documentation has been reviewed and is   accurate and complete. Holly Johns, D.O.  Holly Wallace, DO [place], [facility] 10/10/2018 

## 2018-10-11 LAB — HIV ANTIBODY (ROUTINE TESTING W REFLEX): HIV 1&2 Ab, 4th Generation: NONREACTIVE

## 2018-10-13 LAB — CYTOLOGY - PAP
Bacterial vaginitis: POSITIVE — AB
Candida vaginitis: NEGATIVE
Chlamydia: NEGATIVE
Diagnosis: NEGATIVE
Neisseria Gonorrhea: NEGATIVE
Trichomonas: NEGATIVE

## 2018-10-15 ENCOUNTER — Encounter: Payer: Self-pay | Admitting: Family Medicine

## 2018-10-16 MED ORDER — METRONIDAZOLE 500 MG PO TABS: 500.0000 mg | ORAL_TABLET | Freq: Three times a day (TID) | ORAL | 0 refills | Status: DC

## 2018-10-19 DIAGNOSIS — F341 Dysthymic disorder: Secondary | ICD-10-CM | POA: Diagnosis not present

## 2018-11-09 DIAGNOSIS — F341 Dysthymic disorder: Secondary | ICD-10-CM | POA: Diagnosis not present

## 2018-12-13 ENCOUNTER — Ambulatory Visit: Payer: BLUE CROSS/BLUE SHIELD

## 2019-05-30 DIAGNOSIS — F331 Major depressive disorder, recurrent, moderate: Secondary | ICD-10-CM | POA: Diagnosis not present

## 2019-06-11 DIAGNOSIS — F331 Major depressive disorder, recurrent, moderate: Secondary | ICD-10-CM | POA: Diagnosis not present

## 2019-06-18 DIAGNOSIS — F331 Major depressive disorder, recurrent, moderate: Secondary | ICD-10-CM | POA: Diagnosis not present

## 2019-06-25 DIAGNOSIS — F331 Major depressive disorder, recurrent, moderate: Secondary | ICD-10-CM | POA: Diagnosis not present

## 2019-07-02 DIAGNOSIS — F331 Major depressive disorder, recurrent, moderate: Secondary | ICD-10-CM | POA: Diagnosis not present

## 2019-07-13 ENCOUNTER — Ambulatory Visit (INDEPENDENT_AMBULATORY_CARE_PROVIDER_SITE_OTHER)
Admission: RE | Admit: 2019-07-13 | Discharge: 2019-07-13 | Disposition: A | Discharge status: Home / Self Care | Payer: BC Managed Care – PPO | Source: Ambulatory Visit

## 2019-07-13 ENCOUNTER — Other Ambulatory Visit: Payer: Self-pay

## 2019-07-13 DIAGNOSIS — J309 Allergic rhinitis, unspecified: Secondary | ICD-10-CM

## 2019-07-13 DIAGNOSIS — L309 Dermatitis, unspecified: Secondary | ICD-10-CM

## 2019-07-13 MED ORDER — FLUTICASONE PROPIONATE 50 MCG/ACT NA SUSP: 1.0000 | Freq: Every day | NASAL | 0 refills | Status: DC

## 2019-07-13 MED ORDER — TRIAMCINOLONE ACETONIDE 0.1 % EX CREA: 1.0000 "application " | TOPICAL_CREAM | Freq: Two times a day (BID) | CUTANEOUS | 0 refills | Status: DC

## 2019-07-13 NOTE — Discharge Instructions (Signed)
Use the triamcinolone cream as directed for your eczema.    Use the Flonase nasal spray as directed.  Continue to take Zyrtec as needed for your allergy symptoms.    Be seen in person either here or by your primary care provider if your symptoms are not improving or get worse.

## 2019-07-13 NOTE — ED Provider Notes (Signed)
Virtual Visit via Video Note:  Holly Johns  initiated request for Telemedicine visit with Holly Johns, Holly Johns. I connected with Holly Johns  on 07/13/2019 at 9:14 AM  for a synchronized telemedicine visit using a video enabled HIPPA compliant telemedicine application. I verified that I am speaking with Holly Johns  using two identifiers. Holly Balloon, Holly Johns  was physically located in a [facility] Urgent care site and Holly Johns was located at a different location.   The limitations of evaluation and management by telemedicine as well as the availability of in-Johns appointments were discussed. Patient was informed that she  may incur a bill ( including co-pay) for this virtual visit encounter. Holly Johns  expressed understanding and gave verbal consent to proceed with virtual visit.     History of Present Illness:Holly Johns  is a 28 y.o. female presents for evaluation of eczema rash behind both ears x 1 week. She has been using hydrocortisone cream with moderate relief.  She also reports nasal congestion and runny nose intermittently x 2 weeks.  Tried Zyrtec with some relief. Denies fever, chills, ear ache, sore throat, vomiting, diarrhea, or other symptoms.  LMP: current.    Allergies  Allergen Reactions  . Peanuts [Peanut Oil] Hives and Itching     Past Medical History:  Diagnosis Date  . Allergy   . Depression      Social History   Tobacco Use  . Smoking status: Never Smoker  . Smokeless tobacco: Never Used  Substance Use Topics  . Alcohol use: No  . Drug use: No        Observations/Objective: Physical Exam  VITALS: Patient denies fever. GENERAL: Alert, appears well and in no acute distress. HEENT: Atraumatic. NECK: Normal movements of the head and neck. CARDIOPULMONARY: No increased WOB. Speaking in clear sentences. I:E ratio WNL.  MS: Moves all visible extremities without noticeable abnormality. PSYCH: Pleasant and cooperative,  well-groomed. Speech normal rate and rhythm. Affect is appropriate. Insight and judgement are appropriate. Attention is focused, linear, and appropriate.  NEURO: CN grossly intact. Oriented as arrived to appointment on time with no prompting. Moves both UE equally.     Assessment and Plan:    ICD-10-CM   1. Eczema, unspecified type  L30.9   2. Allergic rhinitis, unspecified seasonality, unspecified trigger  J30.9        Follow Up Instructions: Treating eczema rash with triamcinolone cream.  Treating junk rhinitis with Flonase nasal spray and continued use of OTC Zyrtec.  Discussed with patient that she should plan to be seen here in [place] or by her PCP if her symptoms or not improving or get worse.  Patient agrees to plan of care.   I discussed the assessment and treatment plan with the patient. The patient was provided an opportunity to ask questions and all were answered. The patient agreed with the plan and demonstrated an understanding of the instructions.   The patient was advised to call back or seek an in-Johns evaluation if the symptoms worsen or if the condition fails to improve as anticipated.      Holly Balloon, Holly Johns  07/13/2019 9:14 AM         Holly Balloon, Holly Johns 07/13/19 [phone]

## 2019-07-23 ENCOUNTER — Telehealth: Payer: Self-pay | Admitting: General Practice

## 2019-07-23 NOTE — Telephone Encounter (Signed)
Called patient to schedule with Dr. Rogers Blocker. No answer, LVM. Will call pt again tomorrow to schedule.

## 2019-07-23 NOTE — Telephone Encounter (Signed)
Please see message below and advise.

## 2019-07-23 NOTE — Telephone Encounter (Signed)
See comments  

## 2019-07-23 NOTE — Telephone Encounter (Signed)
Holly Johns, see message and advise if you would see pt in office?

## 2019-07-23 NOTE — Telephone Encounter (Signed)
Patient calling to schedule an urgent care follow up as soon as possible:  nasal spray not working, blood in mucus, runny and congested nose, no fever- symptoms been going on over a month. Patient is very concerned and does NOT want a virtual visit.  She was a Holly Johns pt, sending to all care teams. Please advise. Thank you!

## 2019-07-23 NOTE — Telephone Encounter (Signed)
I decline.   I am not comfortable seeing this patient in the office, as it appears she has not had a COVID-19 test yet and is having COVID-19 symptoms.

## 2019-07-23 NOTE — Telephone Encounter (Signed)
I dont' mind seeing her. She's had symptoms for a month so not worried about covid, but I can't see her until later on in week.  Orma Flaming, MD [place]

## 2019-07-24 NOTE — Telephone Encounter (Signed)
Patient scheduled for 11/6 @ 11:20 with Dr. Rogers Blocker

## 2019-07-27 ENCOUNTER — Ambulatory Visit: Payer: BC Managed Care – PPO | Admitting: Family Medicine

## 2020-08-23 ENCOUNTER — Ambulatory Visit: Payer: Self-pay | Attending: Internal Medicine

## 2020-08-23 DIAGNOSIS — Z23 Encounter for immunization: Secondary | ICD-10-CM

## 2020-08-23 NOTE — Progress Notes (Signed)
   Covid-19 Vaccination Clinic  Name:  Holly Johns    MRN: [id number] DOB: 09/24/1990  08/23/2020  [name] was observed post Covid-19 immunization for 15 minutes without incident. She was provided with Vaccine Information Sheet and instruction to access the V-Safe system.   [name] was instructed to call 911 with any severe reactions post vaccine: Marland Kitchen Difficulty breathing  . Swelling of face and throat  . A fast heartbeat  . A bad rash all over body  . Dizziness and weakness   Immunizations Administered    Name Date Dose VIS Date Route   Pfizer COVID-19 Vaccine 08/23/2020 11:47 AM 0.3 mL 07/09/2020 Intramuscular   Manufacturer: [employer], [employer]   Lot: [id number]   NDC: [address]-9

## 2021-03-26 ENCOUNTER — Ambulatory Visit (HOSPITAL_COMMUNITY): Payer: Self-pay

## 2021-04-16 DIAGNOSIS — N920 Excessive and frequent menstruation with regular cycle: Secondary | ICD-10-CM | POA: Diagnosis not present

## 2021-04-16 DIAGNOSIS — G44221 Chronic tension-type headache, intractable: Secondary | ICD-10-CM | POA: Diagnosis not present

## 2021-04-16 DIAGNOSIS — D509 Iron deficiency anemia, unspecified: Secondary | ICD-10-CM | POA: Diagnosis not present

## 2021-04-16 DIAGNOSIS — D500 Iron deficiency anemia secondary to blood loss (chronic): Secondary | ICD-10-CM | POA: Diagnosis not present

## 2021-04-20 ENCOUNTER — Emergency Department (HOSPITAL_COMMUNITY)
Admission: EM | Admit: 2021-04-20 | Discharge: 2021-04-20 | Disposition: A | Discharge status: Home / Self Care | Payer: BC Managed Care – PPO | Attending: Emergency Medicine | Admitting: Emergency Medicine

## 2021-04-20 ENCOUNTER — Encounter (HOSPITAL_COMMUNITY): Payer: Self-pay

## 2021-04-20 ENCOUNTER — Other Ambulatory Visit: Payer: Self-pay

## 2021-04-20 DIAGNOSIS — Z91010 Allergy to peanuts: Secondary | ICD-10-CM | POA: Diagnosis not present

## 2021-04-20 DIAGNOSIS — R202 Paresthesia of skin: Secondary | ICD-10-CM | POA: Diagnosis not present

## 2021-04-20 DIAGNOSIS — M79601 Pain in right arm: Secondary | ICD-10-CM | POA: Diagnosis not present

## 2021-04-20 NOTE — ED Provider Notes (Signed)
[facility]-EMERGENCY DEPT Provider Note   CSN: [id number] Arrival date & time: 04/20/21  1855     History Chief Complaint  Patient presents with   right arm pain    Holly Johns is a 30 y.o. female.  30 year old female presents with concern for nerve damage to her right arm after a lab draw at her doctor's office on Thursday.  Patient states that she had a particularly painful stick which she felt was due to being dehydrated.  Reports having a tingling sensation in her thumb and fifth fingers since that time.  Denies any swelling or redness in the arm.  No other complaints or concerns.      Past Medical History:  Diagnosis Date   Allergy    Depression     There are no problems to display for this patient.   History reviewed. No pertinent surgical history.   OB History   No obstetric history on file.     Family History  Problem Relation Age of Onset   Diabetes Mother    Stroke Mother    Diabetes Father     Social History   Tobacco Use   Smoking status: Never   Smokeless tobacco: Never  Vaping Use   Vaping Use: Never used  Substance Use Topics   Alcohol use: No   Drug use: No    Home Medications Prior to Admission medications   Medication Sig Start Date End Date Taking? Authorizing Provider  EPINEPHrine 0.3 mg/0.3 mL IJ SOAJ injection Inject 0.3 mg into the muscle daily as needed (allergic reaction).    [provider]  fluticasone (FLONASE) 50 MCG/ACT nasal spray Place 1 spray into both nostrils daily. 07/13/19   Mickie Bail, NP  metroNIDAZOLE (FLAGYL) 500 MG tablet Take 1 tablet (500 mg total) by mouth 3 (three) times daily. 10/16/18   Helane Rima, DO  triamcinolone cream (KENALOG) 0.1 % Apply 1 application topically 2 (two) times daily. 07/13/19   Mickie Bail, NP    Allergies    Peanuts [peanut oil]  Review of Systems   Review of Systems  Constitutional:  Negative for fever.  Musculoskeletal:  Negative for  arthralgias and myalgias.  Skin:  Negative for color change, rash and wound.  Allergic/Immunologic: Negative for immunocompromised state.  Neurological:  Positive for numbness. Negative for weakness.   Physical Exam Updated Vital Signs BP 126/87 (BP Location: Right Arm)   Pulse 98   Temp 98.1 F (36.7 C) (Oral)   Resp 16   Ht 5\' 5"  (1.651 m)   Wt 62.6 kg   SpO2 100%   BMI 22.96 kg/m   Physical Exam Vitals and nursing note reviewed.  Constitutional:      General: She is not in acute distress.    Appearance: She is well-developed. She is not diaphoretic.  HENT:     Head: Normocephalic and atraumatic.  Cardiovascular:     Pulses: Normal pulses.  Pulmonary:     Effort: Pulmonary effort is normal.  Musculoskeletal:        General: No swelling or tenderness. Normal range of motion.  Skin:    General: Skin is warm and dry.     Capillary Refill: Capillary refill takes less than 2 seconds.     Findings: No erythema or rash.  Neurological:     Mental Status: She is alert and oriented to person, place, and time.     Sensory: No sensory deficit.     Motor:  No weakness.  Psychiatric:        Behavior: Behavior normal.    ED Results / Procedures / Treatments   Labs (all labs ordered are listed, but only abnormal results are displayed) Labs Reviewed - No data to display  EKG None  Radiology No results found.  Procedures Procedures   Medications Ordered in ED Medications - No data to display  ED Course  I have reviewed the triage vital signs and the nursing notes.  Pertinent labs & imaging results that were available during my care of the patient were reviewed by me and considered in my medical decision making (see chart for details).  Clinical Course as of 04/20/21 2139  Mon Apr 20, 2021  6356 30 year old female with concern for tingling in her right thumb and fifth fingers after lab draw at her doctor's office on Thursday.  Reports tingling altered sensation to  fifth and first digits of the right hand.  There is no swelling or erythema of the site at the right [facility].  Brisk capillary refill present to each digit with equal grip strength. Encouraged to call PCP to discuss concerns, given referral to neurology if needed. [LM]    Clinical Course User Index [LM] Alden Hipp   MDM Rules/Calculators/A&P                           Final Clinical Impression(s) / ED Diagnoses Final diagnoses:  Paresthesia of right upper limb    Rx / DC Orders ED Discharge Orders     None        Jeannie Fend, PA-C 04/20/21 2140    Virgina Norfolk, DO 04/20/21 2249

## 2021-04-20 NOTE — Discharge Instructions (Addendum)
The numbness in your hand may improve over the next month.  You can always call your doctor to discuss your concerns.  Information given for neurology if needed.

## 2021-04-20 NOTE — ED Triage Notes (Signed)
Pt had some labs drawn at her PCP on Thursday. Pt states that she has been having some right arm nerve pain ever since.

## 2021-05-07 DIAGNOSIS — G44221 Chronic tension-type headache, intractable: Secondary | ICD-10-CM | POA: Diagnosis not present

## 2021-05-07 DIAGNOSIS — N920 Excessive and frequent menstruation with regular cycle: Secondary | ICD-10-CM | POA: Diagnosis not present

## 2021-05-07 DIAGNOSIS — D500 Iron deficiency anemia secondary to blood loss (chronic): Secondary | ICD-10-CM | POA: Diagnosis not present

## 2021-05-28 DIAGNOSIS — G44221 Chronic tension-type headache, intractable: Secondary | ICD-10-CM | POA: Diagnosis not present

## 2021-07-02 DIAGNOSIS — R519 Headache, unspecified: Secondary | ICD-10-CM | POA: Diagnosis not present

## 2021-07-02 DIAGNOSIS — G44221 Chronic tension-type headache, intractable: Secondary | ICD-10-CM | POA: Diagnosis not present

## 2021-07-02 DIAGNOSIS — F330 Major depressive disorder, recurrent, mild: Secondary | ICD-10-CM | POA: Diagnosis not present

## 2021-07-16 DIAGNOSIS — G44221 Chronic tension-type headache, intractable: Secondary | ICD-10-CM | POA: Diagnosis not present

## 2021-07-16 DIAGNOSIS — F32A Depression, unspecified: Secondary | ICD-10-CM | POA: Diagnosis not present

## 2021-07-16 DIAGNOSIS — D500 Iron deficiency anemia secondary to blood loss (chronic): Secondary | ICD-10-CM | POA: Diagnosis not present

## 2021-07-23 DIAGNOSIS — G43709 Chronic migraine without aura, not intractable, without status migrainosus: Secondary | ICD-10-CM | POA: Diagnosis not present

## 2023-10-17 ENCOUNTER — Other Ambulatory Visit: Payer: Self-pay

## 2023-10-17 ENCOUNTER — Encounter (HOSPITAL_BASED_OUTPATIENT_CLINIC_OR_DEPARTMENT_OTHER): Payer: Self-pay | Admitting: Orthopedic Surgery

## 2023-10-18 ENCOUNTER — Other Ambulatory Visit (HOSPITAL_COMMUNITY): Payer: Self-pay | Admitting: Orthopedic Surgery

## 2023-10-20 ENCOUNTER — Ambulatory Visit (HOSPITAL_BASED_OUTPATIENT_CLINIC_OR_DEPARTMENT_OTHER)
Admission: RE | Admit: 2023-10-20 | Discharge: 2023-10-20 | Disposition: A | Discharge status: Home / Self Care | Payer: BC Managed Care – PPO | Attending: Orthopedic Surgery | Admitting: Orthopedic Surgery

## 2023-10-20 ENCOUNTER — Encounter (HOSPITAL_BASED_OUTPATIENT_CLINIC_OR_DEPARTMENT_OTHER)
Admission: RE | Disposition: A | Discharge status: Home / Self Care | Payer: Self-pay | Source: Home / Self Care | Attending: Orthopedic Surgery

## 2023-10-20 ENCOUNTER — Other Ambulatory Visit: Payer: Self-pay

## 2023-10-20 ENCOUNTER — Encounter (HOSPITAL_BASED_OUTPATIENT_CLINIC_OR_DEPARTMENT_OTHER): Payer: Self-pay | Admitting: Orthopedic Surgery

## 2023-10-20 ENCOUNTER — Ambulatory Visit (HOSPITAL_BASED_OUTPATIENT_CLINIC_OR_DEPARTMENT_OTHER): Payer: BC Managed Care – PPO | Admitting: Anesthesiology

## 2023-10-20 ENCOUNTER — Ambulatory Visit (HOSPITAL_BASED_OUTPATIENT_CLINIC_OR_DEPARTMENT_OTHER): Payer: BC Managed Care – PPO

## 2023-10-20 DIAGNOSIS — Z01818 Encounter for other preprocedural examination: Secondary | ICD-10-CM

## 2023-10-20 DIAGNOSIS — Y9351 Activity, roller skating (inline) and skateboarding: Secondary | ICD-10-CM | POA: Diagnosis not present

## 2023-10-20 DIAGNOSIS — S82841A Displaced bimalleolar fracture of right lower leg, initial encounter for closed fracture: Secondary | ICD-10-CM | POA: Insufficient documentation

## 2023-10-20 DIAGNOSIS — X58XXXA Exposure to other specified factors, initial encounter: Secondary | ICD-10-CM | POA: Insufficient documentation

## 2023-10-20 DIAGNOSIS — S93431A Sprain of tibiofibular ligament of right ankle, initial encounter: Secondary | ICD-10-CM | POA: Diagnosis not present

## 2023-10-20 HISTORY — DX: Headache, unspecified: R51.9

## 2023-10-20 HISTORY — PX: SYNDESMOSIS REPAIR: SHX5182

## 2023-10-20 HISTORY — PX: ORIF ANKLE FRACTURE: SHX5408

## 2023-10-20 LAB — POCT PREGNANCY, URINE: Preg Test, Ur: NEGATIVE

## 2023-10-20 SURGERY — OPEN REDUCTION INTERNAL FIXATION (ORIF) ANKLE FRACTURE
Anesthesia: General | Site: Ankle | Laterality: Right

## 2023-10-20 MED ORDER — DEXAMETHASONE SODIUM PHOSPHATE 10 MG/ML IJ SOLN
INTRAMUSCULAR | Status: DC | PRN
  Administered 2023-10-20: 10 mg via INTRAVENOUS

## 2023-10-20 MED ORDER — ONDANSETRON HCL 4 MG/2ML IJ SOLN
INTRAMUSCULAR | Status: AC
  Filled 2023-10-20: qty 2

## 2023-10-20 MED ORDER — CEFAZOLIN SODIUM-DEXTROSE 1-4 GM/50ML-% IV SOLN
INTRAVENOUS | Status: AC
Start: 2023-10-20 — End: ?
  Filled 2023-10-20: qty 100

## 2023-10-20 MED ORDER — AMISULPRIDE (ANTIEMETIC) 5 MG/2ML IV SOLN: 10.0000 mg | Freq: Once | INTRAVENOUS | Status: DC | PRN

## 2023-10-20 MED ORDER — CELECOXIB 200 MG PO CAPS
ORAL_CAPSULE | ORAL | Status: AC
  Filled 2023-10-20: qty 1

## 2023-10-20 MED ORDER — FENTANYL CITRATE (PF) 100 MCG/2ML IJ SOLN
100.0000 ug | Freq: Once | INTRAMUSCULAR | Status: AC
  Administered 2023-10-20: 50 ug via INTRAVENOUS

## 2023-10-20 MED ORDER — LIDOCAINE 2% (20 MG/ML) 5 ML SYRINGE
INTRAMUSCULAR | Status: DC | PRN
  Administered 2023-10-20: 20 mg via INTRAVENOUS

## 2023-10-20 MED ORDER — MIDAZOLAM HCL 2 MG/2ML IJ SOLN
INTRAMUSCULAR | Status: AC
  Filled 2023-10-20: qty 2

## 2023-10-20 MED ORDER — FENTANYL CITRATE (PF) 100 MCG/2ML IJ SOLN
INTRAMUSCULAR | Status: DC | PRN
  Administered 2023-10-20: 50 ug via INTRAVENOUS

## 2023-10-20 MED ORDER — LIDOCAINE 2% (20 MG/ML) 5 ML SYRINGE
INTRAMUSCULAR | Status: AC
Start: 2023-10-20 — End: ?
  Filled 2023-10-20: qty 5

## 2023-10-20 MED ORDER — PROPOFOL 10 MG/ML IV BOLUS
INTRAVENOUS | Status: AC
  Filled 2023-10-20: qty 20

## 2023-10-20 MED ORDER — CELECOXIB 200 MG PO CAPS
200.0000 mg | ORAL_CAPSULE | Freq: Once | ORAL | Status: AC
Start: 2023-10-20 — End: 2023-10-20
  Administered 2023-10-20: 200 mg via ORAL

## 2023-10-20 MED ORDER — OXYCODONE HCL 5 MG/5ML PO SOLN: 5.0000 mg | Freq: Once | ORAL | Status: DC | PRN

## 2023-10-20 MED ORDER — BUPIVACAINE LIPOSOME 1.3 % IJ SUSP
INTRAMUSCULAR | Status: DC | PRN
  Administered 2023-10-20: 10 mL via PERINEURAL

## 2023-10-20 MED ORDER — ONDANSETRON HCL 4 MG/2ML IJ SOLN
INTRAMUSCULAR | Status: DC | PRN
  Administered 2023-10-20: 4 mg via INTRAVENOUS

## 2023-10-20 MED ORDER — PROPOFOL 10 MG/ML IV BOLUS
INTRAVENOUS | Status: DC | PRN
  Administered 2023-10-20: 70 mg via INTRAVENOUS
  Administered 2023-10-20: 200 mg via INTRAVENOUS

## 2023-10-20 MED ORDER — LACTATED RINGERS IV SOLN: INTRAVENOUS | Status: DC

## 2023-10-20 MED ORDER — ACETAMINOPHEN 500 MG PO TABS
ORAL_TABLET | ORAL | Status: AC
  Filled 2023-10-20: qty 2

## 2023-10-20 MED ORDER — OXYCODONE HCL 5 MG PO TABS: 5.0000 mg | ORAL_TABLET | Freq: Once | ORAL | Status: DC | PRN

## 2023-10-20 MED ORDER — BUPIVACAINE HCL (PF) 0.5 % IJ SOLN
INTRAMUSCULAR | Status: DC | PRN
  Administered 2023-10-20 (×2): 15 mL via PERINEURAL

## 2023-10-20 MED ORDER — MIDAZOLAM HCL 2 MG/2ML IJ SOLN
2.0000 mg | Freq: Once | INTRAMUSCULAR | Status: AC
  Administered 2023-10-20: 2 mg via INTRAVENOUS

## 2023-10-20 MED ORDER — FENTANYL CITRATE (PF) 100 MCG/2ML IJ SOLN
INTRAMUSCULAR | Status: AC
  Filled 2023-10-20: qty 2

## 2023-10-20 MED ORDER — FENTANYL CITRATE (PF) 100 MCG/2ML IJ SOLN: 25.0000 ug | INTRAMUSCULAR | Status: DC | PRN

## 2023-10-20 MED ORDER — MIDAZOLAM HCL 5 MG/5ML IJ SOLN
INTRAMUSCULAR | Status: DC | PRN
  Administered 2023-10-20: 2 mg via INTRAVENOUS

## 2023-10-20 MED ORDER — OXYCODONE HCL 5 MG PO TABS: 5.0000 mg | ORAL_TABLET | Freq: Four times a day (QID) | ORAL | 0 refills | Status: AC | PRN

## 2023-10-20 MED ORDER — 0.9 % SODIUM CHLORIDE (POUR BTL) OPTIME
TOPICAL | Status: DC | PRN
  Administered 2023-10-20: 200 mL

## 2023-10-20 MED ORDER — CEFAZOLIN SODIUM-DEXTROSE 2-4 GM/100ML-% IV SOLN
2.0000 g | INTRAVENOUS | Status: AC
  Administered 2023-10-20: 2 g via INTRAVENOUS

## 2023-10-20 MED ORDER — DEXAMETHASONE SODIUM PHOSPHATE 10 MG/ML IJ SOLN
INTRAMUSCULAR | Status: AC
Start: 2023-10-20 — End: ?
  Filled 2023-10-20: qty 1

## 2023-10-20 MED ORDER — ACETAMINOPHEN 500 MG PO TABS
1000.0000 mg | ORAL_TABLET | Freq: Once | ORAL | Status: AC
  Administered 2023-10-20: 1000 mg via ORAL

## 2023-10-20 MED ORDER — VANCOMYCIN HCL 500 MG IV SOLR
INTRAVENOUS | Status: DC | PRN
  Administered 2023-10-20: 500 mg via TOPICAL

## 2023-10-20 MED ORDER — SODIUM CHLORIDE 0.9 % IV SOLN
12.5000 mg | INTRAVENOUS | Status: DC | PRN
  Filled 2023-10-20: qty 0.5

## 2023-10-20 SURGICAL SUPPLY — 67 items
BANDAGE ESMARK 6X9 LF (GAUZE/BANDAGES/DRESSINGS) IMPLANT
BIT DRILL 110X2.5XQCK CNCT (BIT) IMPLANT
BIT DRILL 2.7XCANN QCK CNCT (BIT) IMPLANT
BIT DRL 110X2.5XQCK CNCT (BIT) ×1
BIT DRL 2.7XCANN QCK CNCT (BIT) ×1
BLADE SURG 15 STRL LF DISP TIS (BLADE) ×2 IMPLANT
BNDG ELASTIC 4INX 5YD STR LF (GAUZE/BANDAGES/DRESSINGS) ×1 IMPLANT
BNDG ELASTIC 6INX 5YD STR LF (GAUZE/BANDAGES/DRESSINGS) ×1 IMPLANT
BNDG ESMARK 4X9 LF (GAUZE/BANDAGES/DRESSINGS) IMPLANT
BNDG ESMARK 6X9 LF (GAUZE/BANDAGES/DRESSINGS)
CANISTER SUCT 1200ML W/VALVE (MISCELLANEOUS) ×1 IMPLANT
CHLORAPREP W/TINT 26 (MISCELLANEOUS) ×1 IMPLANT
COVER BACK TABLE 60X90IN (DRAPES) ×1 IMPLANT
COVER SURGICAL LIGHT HANDLE (MISCELLANEOUS) IMPLANT
CUFF TRNQT CYL 24X4X16.5-23 (TOURNIQUET CUFF) IMPLANT
CUFF TRNQT CYL 34X4.125X (TOURNIQUET CUFF) IMPLANT
DRAPE EXTREMITY T 121X128X90 (DISPOSABLE) ×1 IMPLANT
DRAPE OEC MINIVIEW 54X84 (DRAPES) ×1 IMPLANT
DRAPE U-SHAPE 47X51 STRL (DRAPES) ×1 IMPLANT
DRSG MEPITEL 4X7.2 (GAUZE/BANDAGES/DRESSINGS) ×1 IMPLANT
ELECT REM PT RETURN 9FT ADLT (ELECTROSURGICAL) ×1
ELECTRODE REM PT RTRN 9FT ADLT (ELECTROSURGICAL) ×1 IMPLANT
GAUZE PAD ABD 8X10 STRL (GAUZE/BANDAGES/DRESSINGS) ×2 IMPLANT
GAUZE SPONGE 4X4 12PLY STRL (GAUZE/BANDAGES/DRESSINGS) ×1 IMPLANT
GLOVE BIO SURGEON STRL SZ8 (GLOVE) ×1 IMPLANT
GLOVE BIOGEL PI IND STRL 7.0 (GLOVE) IMPLANT
GLOVE BIOGEL PI IND STRL 8 (GLOVE) ×2 IMPLANT
GLOVE ECLIPSE 8.0 STRL XLNG CF (GLOVE) ×1 IMPLANT
GLOVE SURG SS PI 7.0 STRL IVOR (GLOVE) IMPLANT
GOWN STRL REUS W/ TWL LRG LVL3 (GOWN DISPOSABLE) ×1 IMPLANT
GOWN STRL REUS W/ TWL XL LVL3 (GOWN DISPOSABLE) ×2 IMPLANT
GUIDEWIRE PIN ORTH 6X1.6XSMTH (WIRE) IMPLANT
NDL HYPO 22X1.5 SAFETY MO (MISCELLANEOUS) IMPLANT
NEEDLE HYPO 22X1.5 SAFETY MO (MISCELLANEOUS)
NS IRRIG 1000ML POUR BTL (IV SOLUTION) ×1 IMPLANT
PACK BASIN DAY SURGERY FS (CUSTOM PROCEDURE TRAY) ×1 IMPLANT
PAD CAST 4YDX4 CTTN HI CHSV (CAST SUPPLIES) ×1 IMPLANT
PADDING CAST ABS COTTON 4X4 ST (CAST SUPPLIES) IMPLANT
PADDING CAST COTTON 6X4 STRL (CAST SUPPLIES) ×1 IMPLANT
PENCIL SMOKE EVACUATOR (MISCELLANEOUS) ×1 IMPLANT
PLATE 7HOLE 1/3 TUBULAR (Plate) IMPLANT
SANITIZER HAND PURELL FF 515ML (MISCELLANEOUS) ×1 IMPLANT
SCREW CANN 1/3 THRD RVRS CT (Screw) IMPLANT
SCREW CORT 2.5X20X3.5XST SM (Screw) IMPLANT
SCREW CORTICAL 3.5 16MM (Screw) IMPLANT
SCREW CORTICAL 3.5 18MM (Screw) IMPLANT
SCREW CORTICAL 3.5X12 (Screw) IMPLANT
SCREW CORTICAL 3.5X14 (Screw) IMPLANT
SHEET MEDIUM DRAPE 40X70 STRL (DRAPES) ×1 IMPLANT
SLEEVE SCD COMPRESS KNEE MED (STOCKING) ×1 IMPLANT
SPIKE FLUID TRANSFER (MISCELLANEOUS) IMPLANT
SPLINT PLASTER CAST FAST 5X30 (CAST SUPPLIES) ×20 IMPLANT
SPONGE T-LAP 18X18 ~~LOC~~+RFID (SPONGE) ×1 IMPLANT
STOCKINETTE 6 STRL (DRAPES) ×1 IMPLANT
SUCTION TUBE FRAZIER 10FR DISP (SUCTIONS) ×1 IMPLANT
SUT ETHILON 3 0 PS 1 (SUTURE) ×1 IMPLANT
SUT FIBERWIRE #2 38 T-5 BLUE (SUTURE)
SUT MNCRL AB 3-0 PS2 18 (SUTURE) IMPLANT
SUT VIC AB 2-0 SH 27XBRD (SUTURE) ×1 IMPLANT
SUT VICRYL 0 SH 27 (SUTURE) IMPLANT
SUTURE FIBERWR #2 38 T-5 BLUE (SUTURE) IMPLANT
SYR BULB EAR ULCER 3OZ GRN STR (SYRINGE) ×1 IMPLANT
SYR CONTROL 10ML LL (SYRINGE) IMPLANT
TOWEL GREEN STERILE FF (TOWEL DISPOSABLE) ×2 IMPLANT
TUBE CONNECTING 20X1/4 (TUBING) ×1 IMPLANT
UNDERPAD 30X36 HEAVY ABSORB (UNDERPADS AND DIAPERS) ×1 IMPLANT
WASHER SM (Washer) IMPLANT

## 2023-10-20 NOTE — Transfer of Care (Signed)
Immediate Anesthesia Transfer of Care Note  Patient: Francessca Bembenek  Procedure(s) Performed: OPEN REDUCTION INTERNAL FIXATION (ORIF) BIMALLEOLAR ANKLE FRACTURE (Right: Ankle) SYNDESMOSIS REPAIR (Right: Ankle)  Patient Location: PACU  Anesthesia Type:General and Regional  Level of Consciousness: drowsy  Airway & Oxygen Therapy: Patient Spontanous Breathing and Patient connected to face mask oxygen  Post-op Assessment: Report given to RN and Post -op Vital signs reviewed and stable  Post vital signs: Reviewed and stable  Last Vitals:  Vitals Value Taken Time  BP 103/75 10/20/23 1553  Temp    Pulse 85 10/20/23 1554  Resp 21 10/20/23 1554  SpO2 100 % 10/20/23 1554  Vitals shown include unfiled device data.  Last Pain:  Vitals:   10/20/23 1241  TempSrc: Oral  PainSc: 0-No pain      Patients Stated Pain Goal: 7 (10/20/23 1241)  Complications: No notable events documented.

## 2023-10-20 NOTE — Anesthesia Procedure Notes (Signed)
Procedure Name: LMA Insertion Date/Time: 10/20/2023 2:49 PM  Performed by: Roosvelt Harps, CRNAPre-anesthesia Checklist: Patient identified, Emergency Drugs available, Suction available and Patient being monitored Patient Re-evaluated:Patient Re-evaluated prior to induction Oxygen Delivery Method: Circle System Utilized Preoxygenation: Pre-oxygenation with 100% oxygen Induction Type: IV induction Ventilation: Mask ventilation without difficulty LMA: LMA inserted LMA Size: 4.0 Number of attempts: 1 Airway Equipment and Method: Bite block Placement Confirmation: positive ETCO2 and breath sounds checked- equal and bilateral Tube secured with: Tape Dental Injury: Teeth and Oropharynx as per pre-operative assessment

## 2023-10-20 NOTE — Anesthesia Preprocedure Evaluation (Addendum)
Anesthesia Evaluation  Patient identified by MRN, date of birth, ID band Patient awake    Reviewed: Allergy & Precautions, NPO status , Patient's Chart, lab work & pertinent test results  History of Anesthesia Complications Negative for: history of anesthetic complications  Airway Mallampati: I  TM Distance: >3 FB Neck ROM: Full    Dental  (+) Dental Advisory Given, Teeth Intact   Pulmonary neg pulmonary ROS   Pulmonary exam normal        Cardiovascular negative cardio ROS Normal cardiovascular exam     Neuro/Psych  Headaches PSYCHIATRIC DISORDERS  Depression       GI/Hepatic negative GI ROS, Neg liver ROS,,,  Endo/Other  negative endocrine ROS    Renal/GU negative Renal ROS     Musculoskeletal negative musculoskeletal ROS (+)    Abdominal   Peds  Hematology negative hematology ROS (+)   Anesthesia Other Findings   Reproductive/Obstetrics                             Anesthesia Physical Anesthesia Plan  ASA: 1  Anesthesia Plan: General   Post-op Pain Management: Tylenol PO (pre-op)*, Regional block* and Celebrex PO (pre-op)*   Induction: Intravenous  PONV Risk Score and Plan: 3 and Treatment may vary due to age or medical condition, Ondansetron, Dexamethasone and Midazolam  Airway Management Planned: LMA  Additional Equipment: None  Intra-op Plan:   Post-operative Plan: Extubation in OR  Informed Consent: I have reviewed the patients History and Physical, chart, labs and discussed the procedure including the risks, benefits and alternatives for the proposed anesthesia with the patient or authorized representative who has indicated his/her understanding and acceptance.     Dental advisory given  Plan Discussed with: CRNA and Anesthesiologist  Anesthesia Plan Comments:        Anesthesia Quick Evaluation

## 2023-10-20 NOTE — Anesthesia Procedure Notes (Signed)
Anesthesia Regional Block: Popliteal block   Pre-Anesthetic Checklist: , timeout performed,  Correct Patient, Correct Site, Correct Laterality,  Correct Procedure, Correct Position, site marked,  Risks and benefits discussed,  Surgical consent,  Pre-op evaluation,  At surgeon's request and post-op pain management  Laterality: Right  Prep: chloraprep       Needles:  Injection technique: Single-shot  Needle Type: Echogenic Needle     Needle Length: 10cm  Needle Gauge: 21     Additional Needles:   Narrative:  Start time: 10/20/2023 1:12 PM End time: 10/20/2023 1:15 PM Injection made incrementally with aspirations every 5 mL.  Performed by: Personally  Anesthesiologist: Beryle Lathe, MD  Additional Notes: No pain on injection. No increased resistance to injection. Injection made in 5cc increments. Good needle visualization. Patient tolerated the procedure well.

## 2023-10-20 NOTE — Discharge Instructions (Addendum)
Holly Arthurs, MD [facility]  Please read the following information regarding your care after surgery.  Medications  You only need a prescription for the narcotic pain medicine (ex. oxycodone, Percocet, Norco).  All of the other medicines listed below are available over the counter. X Aleve 2 pills twice a day for the first 3 days after surgery. X acetominophen (Tylenol) 650 mg every 4-6 hours as you need for minor to moderate pain X oxycodone as prescribed for severe pain  Narcotic pain medicine (ex. oxycodone, Percocet, Vicodin) will cause constipation.  To prevent this problem, take the following medicines while you are taking any pain medicine. x docusate sodium (Colace) 100 mg twice a day x senna (Senokot) 2 tablets twice a day  X To help prevent blood clots, take a baby aspirin (81 mg) twice a day for two weeks after surgery.  You should also get up every hour while you are awake to move around.    Weight Bearing X Do not bear any weight on the operated leg or foot.  Cast / Splint / Dressing X Keep your splint, cast or dressing clean and dry.  Don't put anything (coat hanger, pencil, etc) down inside of it.  If it gets damp, use a hair dryer on the cool setting to dry it.  If it gets soaked, call the office to schedule an appointment for a cast change.  After your dressing, cast or splint is removed; you may shower, but do not soak or scrub the wound.  Allow the water to run over it, and then gently pat it dry.  Swelling It is normal for you to have swelling where you had surgery.  To reduce swelling and pain, keep your toes above your nose for at least 3 days after surgery.  It may be necessary to keep your foot or leg elevated for several weeks.  If it hurts, it should be elevated.  Follow Up Call my office at [phone] when you are discharged from the hospital or surgery center to schedule an appointment to be seen two weeks after surgery.  Call my office at [phone] if  you develop a fever >101.5 F, nausea, vomiting, bleeding from the surgical site or severe pain.     Post Anesthesia Home Care Instructions  Activity: Get plenty of rest for the remainder of the day. A responsible individual must stay with you for 24 hours following the procedure.  For the next 24 hours, DO NOT: -Drive a car -Advertising copywriter -Drink alcoholic beverages -Take any medication unless instructed by your physician -Make any legal decisions or sign important papers.  Meals: Start with liquid foods such as gelatin or soup. Progress to regular foods as tolerated. Avoid greasy, spicy, heavy foods. If nausea and/or vomiting occur, drink only clear liquids until the nausea and/or vomiting subsides. Call your physician if vomiting continues.  Special Instructions/Symptoms: Your throat may feel dry or sore from the anesthesia or the breathing tube placed in your throat during surgery. If this causes discomfort, gargle with warm salt water. The discomfort should disappear within 24 hours.  Next dose of Tylenol can be taken at 645pm today. Next dose of Ibuprofen/motrin can be taken at 645pm today.   Regional Anesthesia Blocks  1. You may not be able to move or feel the "blocked" extremity after a regional anesthetic block. This may last may last from 3-48 hours after placement, but it will go away. The length of time depends on the medication injected and  your individual response to the medication. As the nerves start to wake up, you may experience tingling as the movement and feeling returns to your extremity. If the numbness and inability to move your extremity has not gone away after 48 hours, please call your surgeon.   2. The extremity that is blocked will need to be protected until the numbness is gone and the strength has returned. Because you cannot feel it, you will need to take extra care to avoid injury. Because it may be weak, you may have difficulty moving it or using it.  You may not know what position it is in without looking at it while the block is in effect.  3. For blocks in the legs and feet, returning to weight bearing and walking needs to be done carefully. You will need to wait until the numbness is entirely gone and the strength has returned. You should be able to move your leg and foot normally before you try and bear weight or walk. You will need someone to be with you when you first try to ensure you do not fall and possibly risk injury.  4. Bruising and tenderness at the needle site are common side effects and will resolve in a few days.  5. Persistent numbness or new problems with movement should be communicated to the surgeon or the [facility] [id number] [facility] [phone]).

## 2023-10-20 NOTE — Progress Notes (Signed)
Assisted Dr. Mal Amabile with right, adductor canal, popliteal, ultrasound guided block. Side rails up, monitors on throughout procedure. See vital signs in flow sheet. Tolerated Procedure well.

## 2023-10-20 NOTE — Op Note (Signed)
10/20/2023  3:49 PM  PATIENT:  Holly Johns  33 y.o. female  PRE-OPERATIVE DIAGNOSIS:  1.  Right ankle bimalleolar fracture      2.  Right ankle syndesmosis sprain  POST-OPERATIVE DIAGNOSIS: 1.  Right ankle bimalleolar fracture      2.  Stable right ankle syndesmosis  Procedure(s): 1.  Open treatment of right ankle bimalleolar fracture with internal fixation 2.  Stress examination of the right ankle under fluoroscopy 3.  AP, lateral and mortise radiographs of the right ankle  SURGEON:  Toni Arthurs, MD  ASSISTANT: None  ANESTHESIA:   General, regional  EBL:  minimal   TOURNIQUET: Less than 1 hour at 250 mmHg thigh  COMPLICATIONS:  None apparent  DISPOSITION:  Extubated, awake and stable to recovery.  INDICATION FOR PROCEDURE: 33 year old female fell rollerskating over a week ago injuring her right ankle.  Radiographs reveal a Weber C fracture of the lateral malleolus and widening of the ankle mortise.  She also has a displaced posterior malleolus fracture.  She presents now for operative treatment of this displaced and unstable right ankle injury.  The risks and benefits of the alternative treatment options have been discussed in detail.  The patient wishes to proceed with surgery and specifically understands risks of bleeding, infection, nerve damage, blood clots, need for additional surgery, amputation and death.   PROCEDURE IN DETAIL:  After pre operative consent was obtained, and the correct operative site was identified, the patient was brought to the operating room and placed supine on the OR table.  Anesthesia was administered.  Pre-operative antibiotics were administered.  A surgical timeout was taken.  The right lower extremity was prepped and draped in standard sterile fashion with a tourniquet around the thigh.  The extremity was elevated, and the tourniquet was inflated to 250 mmHg.  A longitudinal incision was made over the lateral malleolus.  Dissection was carried  sharply down through the subcutaneous tissues.  The fracture site was identified.  Was cleaned of all hematoma and irrigated.  The interval at the fracture was used to access the posterior malleolus fracture.  It was mobilized and irrigated as well.  A large Weber tenaculum was then used to reduce the posterior malleolus fracture and compress it appropriately.  A lateral radiograph confirmed appropriate reduction of the fracture.  A guidepin was inserted from anterior to posterior across the fracture site.  The guidepin was overdrilled.  A partially-threaded 4 mm stainless steel cannulated screw from the [employer] small frag set was inserted.  It was noted to have excellent purchase with the use of a washer.  Attention was returned to the lateral malleolus.  It was reduced with a 7 hole one third tubular plate over the fracture site.  The plate was secured proximally with 3 bicortical screws and distally with 3 unicortical screws.  AP, mortise and lateral radiographs confirmed appropriate reduction of the lateral malleolus in appropriate position and length of the screws.  The central hole was left open to span the area of comminution at the fracture site.  A stress examination was then performed.  Dorsiflexion and external rotation stress was applied to the supinated forefoot.  A mortise view was obtained.  There was no widening evident at the ankle mortise or instability at the syndesmosis.  The wound was irrigated copiously and sprinkled with vancomycin powder.  Subcutaneous tissues were approximated with 2-0 Vicryl.  The skin incisions were closed with running 3-0 nylon.  Sterile dressings were applied followed  by a well-padded short leg splint.  The tourniquet was released after application of the dressings.  The patient was awakened from anesthesia and transported to the recovery room in stable condition.   FOLLOW UP PLAN: Nonweightbearing on the right lower extremity.  Follow-up in the office in  2 weeks for suture removal and conversion to a short leg cast.  Plan 6 weeks postoperative nonweightbearing immobilization.  Aspirin for DVT prophylaxis.   RADIOGRAPHS: AP, mortise and lateral radiographs of the right ankle are obtained intraoperatively.  These show interval reduction and fixation of the lateral and posterior malleolus fractures.  Hardware is appropriately positioned and of the appropriate lengths.  No other acute injuries are noted.

## 2023-10-20 NOTE — H&P (Signed)
Holly Johns is an 33 y.o. female.   Chief Complaint: right ankle fracture HPI: 33 y/o female without signficant PMH c/o R ankle pain since an injury while roller skating over a week ago.  Xrays in the ER show a bimalleolar fracture with syndesmosis sprain.  She presents today for operative treatment of this unstable and displaced ankle injury.    Past Medical History:  Diagnosis Date   Allergy    Depression    Headache     History reviewed. No pertinent surgical history.  Family History  Problem Relation Age of Onset   Diabetes Mother    Stroke Mother    Diabetes Father    Social History:  reports that she has never smoked. She has never used smokeless tobacco. She reports that she does not drink alcohol and does not use drugs.  Allergies:  Allergies  Allergen Reactions   Peanuts [Peanut Oil] Hives and Itching    No medications prior to admission.    No results found for this or any previous visit (from the past 48 hours). No results found.  Review of Systems  no recent f/c/n/v/wt loss  Height 5\' 5"  (1.651 m), weight 68 kg, last menstrual period 09/26/2023. Physical Exam  Wn wd woman in nad.  A and O.  EOMI.  Resp unlabored.  R ankle with healthy skin and resolving ecchymosis.  MOderate swelling.  Intact sens to LT in the sural and saphenous n distributions.  Active PF and DF strength at the toes.  Assessment/Plan R ankle bimal fracture and syndesmosis sprain - to the OR today for ORIF of bimal and syndesmosis.  The risks and benefits of the alternative treatment options have been discussed in detail.  The patient wishes to proceed with surgery and specifically understands risks of bleeding, infection, nerve damage, blood clots, need for additional surgery, amputation and death.   Toni Arthurs, MD Oct 26, 2023, 11:51 AM

## 2023-10-20 NOTE — Anesthesia Procedure Notes (Signed)
Anesthesia Regional Block: Adductor canal block   Pre-Anesthetic Checklist: , timeout performed,  Correct Patient, Correct Site, Correct Laterality,  Correct Procedure, Correct Position, site marked,  Risks and benefits discussed,  Surgical consent,  Pre-op evaluation,  At surgeon's request and post-op pain management  Laterality: Right  Prep: chloraprep       Needles:  Injection technique: Single-shot  Needle Type: Echogenic Needle     Needle Length: 10cm  Needle Gauge: 21     Additional Needles:   Narrative:  Start time: 10/20/2023 1:08 PM End time: 10/20/2023 1:11 PM Injection made incrementally with aspirations every 5 mL.  Performed by: Personally  Anesthesiologist: Beryle Lathe, MD  Additional Notes: No pain on injection. No increased resistance to injection. Injection made in 5cc increments. Good needle visualization. Patient tolerated the procedure well.

## 2023-10-21 ENCOUNTER — Encounter (HOSPITAL_BASED_OUTPATIENT_CLINIC_OR_DEPARTMENT_OTHER): Payer: Self-pay | Admitting: Orthopedic Surgery

## 2023-10-21 NOTE — Anesthesia Postprocedure Evaluation (Signed)
Anesthesia Post Note  Patient: Chiropodist  Procedure(s) Performed: OPEN REDUCTION INTERNAL FIXATION (ORIF) BIMALLEOLAR ANKLE FRACTURE (Right: Ankle) SYNDESMOSIS REPAIR (Right: Ankle)     Patient location during evaluation: PACU Anesthesia Type: General Level of consciousness: awake and alert Pain management: pain level controlled Vital Signs Assessment: post-procedure vital signs reviewed and stable Respiratory status: spontaneous breathing, nonlabored ventilation and respiratory function stable Cardiovascular status: stable and blood pressure returned to baseline Anesthetic complications: no   No notable events documented.  Last Vitals:  Vitals:   10/20/23 1630 10/20/23 1641  BP:  121/87  Pulse: 80 77  Resp: 16 18  Temp:  (!) 36.2 C  SpO2: 100% 98%    Last Pain:  Vitals:   10/20/23 1641  TempSrc:   PainSc: 0-No pain                 Beryle Lathe
